# Patient Record
Sex: Female | Born: 1945 | Race: White | Hispanic: No | Smoking: Current every day smoker
Health system: Southern US, Community
[De-identification: ages and names within clinical notes are randomized; demographics above are authoritative.]

## PROBLEM LIST (undated history)

## (undated) DIAGNOSIS — M791 Myalgia, unspecified site: Secondary | ICD-10-CM

## (undated) DIAGNOSIS — R51 Headache: Secondary | ICD-10-CM

## (undated) DIAGNOSIS — R233 Spontaneous ecchymoses: Secondary | ICD-10-CM

## (undated) DIAGNOSIS — R609 Edema, unspecified: Secondary | ICD-10-CM

## (undated) DIAGNOSIS — R05 Cough: Secondary | ICD-10-CM

## (undated) DIAGNOSIS — F329 Major depressive disorder, single episode, unspecified: Secondary | ICD-10-CM

## (undated) DIAGNOSIS — J349 Unspecified disorder of nose and nasal sinuses: Secondary | ICD-10-CM

## (undated) DIAGNOSIS — F32A Depression, unspecified: Secondary | ICD-10-CM

## (undated) DIAGNOSIS — R238 Other skin changes: Secondary | ICD-10-CM

## (undated) DIAGNOSIS — R059 Cough, unspecified: Secondary | ICD-10-CM

## (undated) DIAGNOSIS — J189 Pneumonia, unspecified organism: Secondary | ICD-10-CM

## (undated) DIAGNOSIS — H9190 Unspecified hearing loss, unspecified ear: Secondary | ICD-10-CM

## (undated) DIAGNOSIS — R519 Headache, unspecified: Secondary | ICD-10-CM

## (undated) DIAGNOSIS — M1990 Unspecified osteoarthritis, unspecified site: Secondary | ICD-10-CM

## (undated) HISTORY — DX: Myalgia, unspecified site: M79.10

## (undated) HISTORY — DX: Spontaneous ecchymoses: R23.3

## (undated) HISTORY — PX: APPENDECTOMY: SHX54

## (undated) HISTORY — DX: Cough: R05

## (undated) HISTORY — DX: Unspecified disorder of nose and nasal sinuses: J34.9

## (undated) HISTORY — PX: OTHER SURGICAL HISTORY: SHX169

## (undated) HISTORY — DX: Headache, unspecified: R51.9

## (undated) HISTORY — DX: Headache: R51

## (undated) HISTORY — DX: Cough, unspecified: R05.9

## (undated) HISTORY — DX: Edema, unspecified: R60.9

## (undated) HISTORY — DX: Depression, unspecified: F32.A

## (undated) HISTORY — DX: Other skin changes: R23.8

## (undated) HISTORY — PX: TUBAL LIGATION: SHX77

## (undated) HISTORY — DX: Unspecified hearing loss, unspecified ear: H91.90

## (undated) HISTORY — DX: Major depressive disorder, single episode, unspecified: F32.9

## (undated) HISTORY — PX: TONSILLECTOMY: SUR1361

---

## 2013-03-09 ENCOUNTER — Encounter (INDEPENDENT_AMBULATORY_CARE_PROVIDER_SITE_OTHER): Payer: Self-pay

## 2013-03-09 ENCOUNTER — Ambulatory Visit (INDEPENDENT_AMBULATORY_CARE_PROVIDER_SITE_OTHER): Payer: Medicare Other

## 2013-03-09 VITALS — BP 139/77 | HR 62 | Resp 18

## 2013-03-09 DIAGNOSIS — B070 Plantar wart: Secondary | ICD-10-CM

## 2013-03-09 DIAGNOSIS — Q828 Other specified congenital malformations of skin: Secondary | ICD-10-CM

## 2013-03-09 DIAGNOSIS — M79609 Pain in unspecified limb: Secondary | ICD-10-CM

## 2013-03-09 NOTE — Patient Instructions (Signed)

## 2013-03-09 NOTE — Progress Notes (Signed)
  Subjective:    Patient ID: Sara Adams, female    DOB: 06-12-1945, 67 y.o.   MRN: [id number]  HPI left foot hard place on bottom and trims on it and been going on for about two years    Review of Systems  Constitutional: Negative.   HENT: Positive for hearing loss and sinus pressure.        Ear pain, sneezing  Eyes: Negative.   Respiratory: Negative.   Cardiovascular: Positive for leg swelling.  Gastrointestinal: Negative.   Endocrine: Negative.   Genitourinary: Negative.   Musculoskeletal:       Joint pain and back pain and difficulty walking  Skin: Negative.   Allergic/Immunologic: Negative.   Neurological: Positive for dizziness and headaches.  Hematological: Bruises/bleeds easily.  Psychiatric/Behavioral: Negative.        Objective:   Physical Exam Extremity objective findings as follows vascular status is intact pedal pulses palpable DP and PT +2/4 bilateral. Epicritic and proprioceptive sensations intact and symmetric bilateral . There is normal plantar response and DTRs noted. Dermatologically there is nucleated keratotic lesion sub-fifth MTP area bilateral the left is significantly painful tender on direct lateral compression with slight pinpoint bleeding and debridement. This is treated with a styptic agent following debridement. 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 Plan at this time lesions are debrided packed with 60% salicylic acid under occlusion W09 hours patient is advised to followup on an as-needed basis if there's any recurrence or exacerbation. Dispensed instructions for topical salicylic acid application if there is recurrence and using  duct tape for occlusion. Followup with the next month or 2 if recurs  Alvan Dame DPM

## 2019-04-14 ENCOUNTER — Other Ambulatory Visit: Payer: Self-pay | Admitting: Neurosurgery

## 2019-04-14 DIAGNOSIS — Q282 Arteriovenous malformation of cerebral vessels: Secondary | ICD-10-CM

## 2019-05-03 ENCOUNTER — Other Ambulatory Visit: Payer: Self-pay | Admitting: Neurosurgery

## 2019-05-12 ENCOUNTER — Other Ambulatory Visit (HOSPITAL_COMMUNITY)
Admission: RE | Admit: 2019-05-12 | Discharge: 2019-05-12 | Disposition: A | Discharge status: Home / Self Care | Payer: Medicare HMO | Source: Ambulatory Visit | Attending: Surgery | Admitting: Surgery

## 2019-05-12 DIAGNOSIS — Z01812 Encounter for preprocedural laboratory examination: Secondary | ICD-10-CM | POA: Diagnosis present

## 2019-05-12 DIAGNOSIS — Z20822 Contact with and (suspected) exposure to covid-19: Secondary | ICD-10-CM | POA: Insufficient documentation

## 2019-05-13 LAB — NOVEL CORONAVIRUS, NAA (HOSP ORDER, SEND-OUT TO REF LAB; TAT 18-24 HRS): SARS-CoV-2, NAA: NOT DETECTED

## 2019-05-16 ENCOUNTER — Ambulatory Visit (HOSPITAL_COMMUNITY)
Admission: RE | Admit: 2019-05-16 | Discharge: 2019-05-16 | Disposition: A | Discharge status: Home / Self Care | Payer: Medicare HMO | Source: Ambulatory Visit | Attending: Neurosurgery | Admitting: Neurosurgery

## 2019-05-16 ENCOUNTER — Other Ambulatory Visit: Payer: Self-pay

## 2019-05-16 ENCOUNTER — Other Ambulatory Visit: Payer: Self-pay | Admitting: Neurosurgery

## 2019-05-16 DIAGNOSIS — Z885 Allergy status to narcotic agent status: Secondary | ICD-10-CM | POA: Diagnosis not present

## 2019-05-16 DIAGNOSIS — Z882 Allergy status to sulfonamides status: Secondary | ICD-10-CM | POA: Diagnosis not present

## 2019-05-16 DIAGNOSIS — F17210 Nicotine dependence, cigarettes, uncomplicated: Secondary | ICD-10-CM | POA: Insufficient documentation

## 2019-05-16 DIAGNOSIS — H9190 Unspecified hearing loss, unspecified ear: Secondary | ICD-10-CM | POA: Insufficient documentation

## 2019-05-16 DIAGNOSIS — Z880 Allergy status to penicillin: Secondary | ICD-10-CM | POA: Diagnosis not present

## 2019-05-16 DIAGNOSIS — Q282 Arteriovenous malformation of cerebral vessels: Secondary | ICD-10-CM

## 2019-05-16 DIAGNOSIS — Z886 Allergy status to analgesic agent status: Secondary | ICD-10-CM | POA: Diagnosis not present

## 2019-05-16 HISTORY — PX: IR ANGIO INTRA EXTRACRAN SEL COM CAROTID INNOMINATE UNI R MOD SED: IMG5359

## 2019-05-16 LAB — BASIC METABOLIC PANEL
Anion gap: 10 (ref 5–15)
BUN: 8 mg/dL (ref 8–23)
CO2: 29 mmol/L (ref 22–32)
Calcium: 9.3 mg/dL (ref 8.9–10.3)
Chloride: 102 mmol/L (ref 98–111)
Creatinine, Ser: 0.58 mg/dL (ref 0.44–1.00)
GFR calc Af Amer: >60 mL/min (ref >60)
GFR calc non Af Amer: >60 mL/min (ref >60)
Glucose, Bld: 97 mg/dL (ref 70–99)
Potassium: 3.8 mmol/L (ref 3.5–5.1)
Sodium: 141 mmol/L (ref 135–145)

## 2019-05-16 LAB — CBC WITH DIFFERENTIAL/PLATELET
Abs Immature Granulocytes: 0.01 10*3/uL (ref 0.00–0.07)
Basophils Absolute: 0.1 10*3/uL (ref 0.0–0.1)
Basophils Relative: 1 %
Eosinophils Absolute: 0.2 10*3/uL (ref 0.0–0.5)
Eosinophils Relative: 3 %
HCT: 43.4 % (ref 36.0–46.0)
Hemoglobin: 14.2 g/dL (ref 12.0–15.0)
Immature Granulocytes: 0 %
Lymphocytes Relative: 35 %
Lymphs Abs: 2.2 10*3/uL (ref 0.7–4.0)
MCH: 31.4 pg (ref 26.0–34.0)
MCHC: 32.7 g/dL (ref 30.0–36.0)
MCV: 96 fL (ref 80.0–100.0)
Monocytes Absolute: 0.4 10*3/uL (ref 0.1–1.0)
Monocytes Relative: 7 %
Neutro Abs: 3.4 10*3/uL (ref 1.7–7.7)
Neutrophils Relative %: 54 %
Platelets: 244 10*3/uL (ref 150–400)
RBC: 4.52 MIL/uL (ref 3.87–5.11)
RDW: 12.2 % (ref 11.5–15.5)
WBC: 6.3 10*3/uL (ref 4.0–10.5)
nRBC: 0 % (ref 0.0–0.2)

## 2019-05-16 LAB — PROTIME-INR
INR: 1 (ref 0.8–1.2)
Prothrombin Time: 13 seconds (ref 11.4–15.2)

## 2019-05-16 LAB — APTT: aPTT: 36 seconds (ref 24–36)

## 2019-05-16 MED ORDER — FENTANYL CITRATE (PF) 100 MCG/2ML IJ SOLN
INTRAMUSCULAR | Status: AC | PRN
  Administered 2019-05-16: 25 ug via INTRAVENOUS

## 2019-05-16 MED ORDER — FENTANYL CITRATE (PF) 100 MCG/2ML IJ SOLN
INTRAMUSCULAR | Status: AC
  Filled 2019-05-16: qty 2

## 2019-05-16 MED ORDER — LIDOCAINE HCL 1 % IJ SOLN
INTRAMUSCULAR | Status: AC | PRN
  Administered 2019-05-16: 8 mL

## 2019-05-16 MED ORDER — MIDAZOLAM HCL 2 MG/2ML IJ SOLN
INTRAMUSCULAR | Status: AC | PRN
  Administered 2019-05-16: 0.5 mg via INTRAVENOUS

## 2019-05-16 MED ORDER — SODIUM CHLORIDE 0.9 % IV SOLN: INTRAVENOUS | Status: DC

## 2019-05-16 MED ORDER — NITROGLYCERIN 1 MG/10 ML FOR IR/CATH LAB
INTRA_ARTERIAL | Status: AC
  Filled 2019-05-16: qty 10

## 2019-05-16 MED ORDER — LIDOCAINE HCL 1 % IJ SOLN
INTRAMUSCULAR | Status: AC
  Filled 2019-05-16: qty 20

## 2019-05-16 MED ORDER — VERAPAMIL HCL 2.5 MG/ML IV SOLN
INTRAVENOUS | Status: AC
  Filled 2019-05-16: qty 2

## 2019-05-16 MED ORDER — MIDAZOLAM HCL 2 MG/2ML IJ SOLN
INTRAMUSCULAR | Status: AC
  Filled 2019-05-16: qty 2

## 2019-05-16 MED ORDER — IOHEXOL 300 MG/ML  SOLN
100.0000 mL | Freq: Once | INTRAMUSCULAR | Status: AC | PRN
  Administered 2019-05-16: 20 mL via INTRA_ARTERIAL

## 2019-05-16 MED ORDER — HEPARIN SODIUM (PORCINE) 1000 UNIT/ML IJ SOLN
INTRAMUSCULAR | Status: AC
  Filled 2019-05-16: qty 1

## 2019-05-16 MED ORDER — HYDROCODONE-ACETAMINOPHEN 5-325 MG PO TABS: 1.0000 | ORAL_TABLET | ORAL | Status: DC | PRN

## 2019-05-16 MED ORDER — IOHEXOL 300 MG/ML  SOLN
100.0000 mL | Freq: Once | INTRAMUSCULAR | Status: AC | PRN
  Administered 2019-05-16: 50 mL via INTRA_ARTERIAL

## 2019-05-16 NOTE — H&P (Addendum)
Chief Complaint   AVM  HPI   HPI: Sara Adams is a 74 y.o. 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  She presents today for diagnostic cerebral angiogram for further characterization of the arterial venous malformation.  She is without any concerns.  There are no problems to display for this patient.   PMH: Past Medical History:  Diagnosis Date  . Bruises easily   . Cough   . Depression   . Frequent headaches   . Hearing loss   . Muscle pain   . Sinus problem   . Swelling     PSH: Past Surgical History:  Procedure Laterality Date  . APPENDECTOMY    . tonsills    . TUBAL LIGATION      (Not in a hospital admission)   SH: Social History   Tobacco Use  . Smoking status: Current Every Day Smoker    Packs/day: 1.00    Types: Cigarettes  Substance Use Topics  . Alcohol use: No  . Drug use: No    MEDS: Prior to Admission medications   Medication Sig Start Date End Date Taking? Authorizing Provider  ALPRAZolam Prudy Feeler) 0.5 MG tablet  01/25/13  Yes [provider]  Multiple Vitamin (MULTIVITAMIN) capsule Take 1 capsule by mouth daily.   Yes [provider]  VENTOLIN HFA 108 (90 BASE) MCG/ACT inhaler  01/25/13   [provider]    ALLERGY: Allergies  Allergen Reactions  . Asa [Aspirin]   . Codeine   . Penicillins   . Sulfa Antibiotics     Social History   Tobacco Use  . Smoking status: Current Every Day Smoker    Packs/day: 1.00    Types: Cigarettes  Substance Use Topics  . Alcohol use: No     No family history on file.   ROS   ROS  Exam   Vitals:   05/16/19 0655  BP: 94/60  Pulse: 79  Resp: 20  Temp: 97.9 F (36.6 C)  SpO2: 95%   General appearance: WDWN, NAD Cardiovascular: Regular rate and rhythm without murmurs, rubs, gallops. No edema or variciosities. Distal  pulses normal. Pulmonary: Effort normal, non-labored breathing Musculoskeletal:     Muscle tone upper extremities: Normal    Muscle tone lower extremities: Normal    Motor exam: Upper Extremities Deltoid Bicep Tricep Grip  Right 5/5 5/5 5/5 5/5  Left 5/5 5/5 5/5 5/5   Lower Extremity IP Quad PF DF EHL  Right 5/5 5/5 5/5 5/5 5/5  Left 5/5 5/5 5/5 5/5 5/5   Neurological Mental Status:    - Patient is awake, alert, oriented to person, place, month, year, and situation    - Patient is able to give a clear and coherent history.    - No signs of aphasia or neglect Cranial Nerves    - II: Visual Fields are full. PERRL    - III/IV/VI: EOMI without ptosis or diploplia.     - V: Facial sensation is grossly normal    - VII: Facial movement is symmetric.     - VIII: hearing is intact to voice    - X: Uvula elevates symmetrically    - XI: Shoulder shrug is symmetric.    - XII: tongue is midline without atrophy or fasciculations.  Sensory: Sensation grossly intact to LT  Results - Imaging/Labs  Results for orders placed or performed during the hospital encounter of 05/16/19 (from the past 48 hour(s))  CBC WITH DIFFERENTIAL     Status: None   Collection Time: 05/16/19  6:55 AM  Result Value Ref Range   WBC 6.3 4.0 - 10.5 K/uL   RBC 4.52 3.87 - 5.11 MIL/uL   Hemoglobin 14.2 12.0 - 15.0 g/dL   HCT 43.4 36.0 - 46.0 %   MCV 96.0 80.0 - 100.0 fL   MCH 31.4 26.0 - 34.0 pg   MCHC 32.7 30.0 - 36.0 g/dL   RDW 12.2 11.5 - 15.5 %   Platelets 244 150 - 400 K/uL   nRBC 0.0 0.0 - 0.2 %   Neutrophils Relative % 54 %   Neutro Abs 3.4 1.7 - 7.7 K/uL   Lymphocytes Relative 35 %   Lymphs Abs 2.2 0.7 - 4.0 K/uL   Monocytes Relative 7 %   Monocytes Absolute 0.4 0.1 - 1.0 K/uL   Eosinophils Relative 3 %   Eosinophils Absolute 0.2 0.0 - 0.5 K/uL   Basophils Relative 1 %   Basophils Absolute 0.1 0.0 - 0.1 K/uL   Immature Granulocytes 0 %   Abs Immature Granulocytes 0.01 0.00 - 0.07 K/uL     Comment: Performed at [facility], [address], [place], [place] [address]    No results found.  IMAGING: MRI of the brain dated 04/07/2019 was personally reviewed.  This demonstrates a large likely arteriovenous malformation in the region of the right temporal pole along the sylvian fissure.  Venous drainage appears to be primarily posteriorly through a large anastomotic vein draining into the right transverse sigmoid junction.  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  Impression/Plan   74 y.o. 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 We will proceed with diagnostic cerebral angiogram for further characterization of the AVM.  Risks, benefits alternatives were discussed.  Patient stated understanding and wished to proceed.  Ferne Reus, PA-C [facility] [employer]

## 2019-05-16 NOTE — Sedation Documentation (Signed)
Right groin sheath removed. 5fr exoseal closure device used.  

## 2019-05-16 NOTE — Brief Op Note (Signed)
  NEUROSURGERY BRIEF OPERATIVE  NOTE   PREOP DX: AVM  POSTOP DX: Same  PROCEDURE: Diagnostic cerebral angiogram  SURGEON: Dr. Lisbeth Renshaw, MD  ANESTHESIA: IV Sedation with Local  EBL: Minimal  SPECIMENS: None  COMPLICATIONS: None  CONDITION: Stable to recovery  FINDINGS (Full report in [facility]): 1. Significant proximal tortuosity in the aortic arch and proximal great vessels including significant calcification precluding selective catheterization of the carotid and vertebral arteries 2. Spetzler-Martin grade 2 right temporal AVM

## 2019-05-16 NOTE — Discharge Instructions (Signed)
Angiogram, Care After °This sheet gives you information about how to care for yourself after your procedure. Your doctor may also give you more specific instructions. If you have problems or questions, contact your doctor. °Follow these instructions at home: °Insertion site care °· Follow instructions from your doctor about how to take care of your long, thin tube (catheter) insertion area. Make sure you: °? Wash your hands with soap and water before you change your bandage (dressing). If you cannot use soap and water, use hand sanitizer. °? Change your bandage as told by your doctor. °? Leave stitches (sutures), skin glue, or skin tape (adhesive) strips in place. They may need to stay in place for 2 weeks or longer. If tape strips get loose and curl up, you may trim the loose edges. Do not remove tape strips completely unless your doctor says it is okay. °· Do not take baths, swim, or use a hot tub until your doctor says it is okay. °· You may shower 24-48 hours after the procedure or as told by your doctor. °? Gently wash the area with plain soap and water. °? Pat the area dry with a clean towel. °? Do not rub the area. This may cause bleeding. °· Do not apply powder or lotion to the area. Keep the area clean and dry. °· Check your insertion area every day for signs of infection. Check for: °? More redness, swelling, or pain. °? Fluid or blood. °? Warmth. °? Pus or a bad smell. °Activity °· Rest as told by your doctor, usually for 1-2 days. °· Do not lift anything that is heavier than 10 lbs. (4.5 kg) or as told by your doctor. °· Do not drive for 24 hours if you were given a medicine to help you relax (sedative). °· Do not drive or use heavy machinery while taking prescription pain medicine. °General instructions ° °· Go back to your normal activities as told by your doctor, usually in about a week. Ask your doctor what activities are safe for you. °· If the insertion area starts to bleed, lie flat and put  pressure on the area. If the bleeding does not stop, get help right away. This is an emergency. °· Drink enough fluid to keep your pee (urine) clear or pale yellow. °· Take over-the-counter and prescription medicines only as told by your doctor. °· Keep all follow-up visits as told by your doctor. This is important. °Contact a doctor if: °· You have a fever. °· You have chills. °· You have more redness, swelling, or pain around your insertion area. °· You have fluid or blood coming from your insertion area. °· The insertion area feels warm to the touch. °· You have pus or a bad smell coming from your insertion area. °· You have more bruising around the insertion area. °· Blood collects in the tissue around the insertion area (hematoma) that may be painful to the touch. °Get help right away if: °· You have a lot of pain in the insertion area. °· The insertion area swells very fast. °· The insertion area is bleeding, and the bleeding does not stop after holding steady pressure on the area. °· The area near or just beyond the insertion area becomes pale, cool, tingly, or numb. °These symptoms may be an emergency. Do not wait to see if the symptoms will go away. Get medical help right away. Call your local emergency services (911 in the [place] Do not drive yourself to the hospital. °  Summary °· After the procedure, it is common to have bruising and tenderness at the long, thin tube insertion area. °· After the procedure, it is important to rest and drink plenty of fluids. °· Do not take baths, swim, or use a hot tub until your doctor says it is okay to do so. You may shower 24-48 hours after the procedure or as told by your doctor. °· If the insertion area starts to bleed, lie flat and put pressure on the area. If the bleeding does not stop, get help right away. This is an emergency. °This information is not intended to replace advice given to you by your health care provider. Make sure you discuss any questions you have  with your health care provider. °Document Revised: 03/26/2017 Document Reviewed: 04/07/2016 °Elsevier Patient Education © 2020 [employer] °Moderate Conscious Sedation, Adult, Care After °These instructions provide you with information about caring for yourself after your procedure. Your health care provider may also give you more specific instructions. Your treatment has been planned according to current medical practices, but problems sometimes occur. Call your health care provider if you have any problems or questions after your procedure. °What can I expect after the procedure? °After your procedure, it is common: °· To feel sleepy for several hours. °· To feel clumsy and have poor balance for several hours. °· To have poor judgment for several hours. °· To vomit if you eat too soon. °Follow these instructions at home: °For at least 24 hours after the procedure: ° °· Do not: °? Participate in activities where you could fall or become injured. °? Drive. °? Use heavy machinery. °? Drink alcohol. °? Take sleeping pills or medicines that cause drowsiness. °? Make important decisions or sign legal documents. °? Take care of children on your own. °· Rest. °Eating and drinking °· Follow the diet recommended by your health care provider. °· If you vomit: °? Drink water, juice, or soup when you can drink without vomiting. °? Make sure you have little or no nausea before eating solid foods. °General instructions °· Have a responsible adult stay with you until you are awake and alert. °· Take over-the-counter and prescription medicines only as told by your health care provider. °· If you smoke, do not smoke without supervision. °· Keep all follow-up visits as told by your health care provider. This is important. °Contact a health care provider if: °· You keep feeling nauseous or you keep vomiting. °· You feel light-headed. °· You develop a rash. °· You have a fever. °Get help right away if: °· You have trouble  breathing. °This information is not intended to replace advice given to you by your health care provider. Make sure you discuss any questions you have with your health care provider. °Document Revised: 03/26/2017 Document Reviewed: 08/03/2015 °Elsevier Patient Education © 2020 [employer] Inc. ° °

## 2019-06-28 ENCOUNTER — Other Ambulatory Visit: Payer: Self-pay | Admitting: Neurosurgery

## 2019-06-28 DIAGNOSIS — Q282 Arteriovenous malformation of cerebral vessels: Secondary | ICD-10-CM

## 2019-06-29 ENCOUNTER — Other Ambulatory Visit: Payer: Self-pay | Admitting: Neurosurgery

## 2019-07-11 ENCOUNTER — Encounter (HOSPITAL_COMMUNITY)
Admission: RE | Admit: 2019-07-11 | Discharge: 2019-07-11 | Disposition: A | Discharge status: Home / Self Care | Payer: Medicare HMO | Source: Ambulatory Visit | Attending: Neurosurgery | Admitting: Neurosurgery

## 2019-07-11 ENCOUNTER — Encounter (HOSPITAL_COMMUNITY): Payer: Self-pay

## 2019-07-11 ENCOUNTER — Other Ambulatory Visit (HOSPITAL_COMMUNITY): Payer: Medicare HMO

## 2019-07-11 ENCOUNTER — Other Ambulatory Visit: Payer: Self-pay

## 2019-07-11 DIAGNOSIS — Z79899 Other long term (current) drug therapy: Secondary | ICD-10-CM | POA: Diagnosis not present

## 2019-07-11 DIAGNOSIS — F17210 Nicotine dependence, cigarettes, uncomplicated: Secondary | ICD-10-CM | POA: Diagnosis not present

## 2019-07-11 DIAGNOSIS — Z01812 Encounter for preprocedural laboratory examination: Secondary | ICD-10-CM | POA: Insufficient documentation

## 2019-07-11 DIAGNOSIS — Q282 Arteriovenous malformation of cerebral vessels: Secondary | ICD-10-CM | POA: Diagnosis not present

## 2019-07-11 HISTORY — DX: Unspecified osteoarthritis, unspecified site: M19.90

## 2019-07-11 HISTORY — DX: Pneumonia, unspecified organism: J18.9

## 2019-07-11 LAB — CBC
HCT: 42.1 % (ref 36.0–46.0)
Hemoglobin: 13.7 g/dL (ref 12.0–15.0)
MCH: 31.4 pg (ref 26.0–34.0)
MCHC: 32.5 g/dL (ref 30.0–36.0)
MCV: 96.6 fL (ref 80.0–100.0)
Platelets: 240 10*3/uL (ref 150–400)
RBC: 4.36 MIL/uL (ref 3.87–5.11)
RDW: 12.1 % (ref 11.5–15.5)
WBC: 7.2 10*3/uL (ref 4.0–10.5)
nRBC: 0 % (ref 0.0–0.2)

## 2019-07-11 LAB — ABO/RH: ABO/RH(D): O POS

## 2019-07-11 NOTE — Progress Notes (Signed)
PCP - Mauricio Po Cardiologist - denies  Chest x-ray - n/a EKG - n/a  COVID TEST- Saturday, 07-15-19   Anesthesia review: n/a  Patient denies shortness of breath, fever, cough and chest pain at PAT appointment   All instructions explained to the patient, with a verbal understanding of the material. Patient agrees to go over the instructions while at home for a better understanding. Patient also instructed to self quarantine after being tested for COVID-19. The opportunity to ask questions was provided.

## 2019-07-11 NOTE — Progress Notes (Signed)
Per Lowella Bandy at Dr. Val Riles office:  At PAT appointment only get consent and blood work (per anesthesia) for Craniotomy. Patient will have follow up Arteriogram the day after Craniotomy surgery.

## 2019-07-11 NOTE — Progress Notes (Signed)
[facility] - Sara Adams, Sara Adams - [address] [place] [address] Phone: [phone] Fax: [phone]      Your procedure is scheduled on Tuesday, March 23rd.  Report to [facility] Main Entrance "A" at 5:30 A.M., and check in at the Admitting office.  Call this number if you have problems the morning of surgery:  [phone]  Call [phone] if you have any questions prior to your surgery date Monday-Friday 8am-4pm    Remember:  Do not eat or drink after midnight the night before your surgery     Take these medicines the morning of surgery with A SIP OF WATER   Alprazolam (Xanax) - if needed  7 days prior to surgery STOP taking any Aspirin (unless otherwise instructed by your surgeon), Aleve, Naproxen, Ibuprofen, Motrin, Advil, Goody's, BC's, all herbal medications, fish oil, and all vitamins.    The Morning of Surgery  Do not wear jewelry, make-up or nail polish.  Do not wear lotions, powders, or perfumes, or deodorant  Do not shave 48 hours prior to surgery.    Do not bring valuables to the hospital.  [facility] is not responsible for any belongings or valuables.  If you are a smoker, DO NOT Smoke 24 hours prior to surgery  If you wear a CPAP at night please bring your mask the morning of surgery   Remember that you must have someone to transport you home after your surgery, and remain with you for 24 hours if you are discharged the same day.   Please bring cases for contacts, glasses, hearing aids, dentures or bridgework because it cannot be worn into surgery.    Leave your suitcase in the car.  After surgery it may be brought to your room.  For patients admitted to the hospital, discharge time will be determined by your treatment team.  Patients discharged the day of surgery will not be allowed to drive home.    Special instructions:   [facility]- Preparing For Surgery  Before surgery, you can play an important role. Because  skin is not sterile, your skin needs to be as free of germs as possible. You can reduce the number of germs on your skin by washing with CHG (chlorahexidine gluconate) Soap before surgery.  CHG is an antiseptic cleaner which kills germs and bonds with the skin to continue killing germs even after washing.    Oral Hygiene is also important to reduce your risk of infection.  Remember - BRUSH YOUR TEETH THE MORNING OF SURGERY WITH YOUR REGULAR TOOTHPASTE  Please do not use if you have an allergy to CHG or antibacterial soaps. If your skin becomes reddened/irritated stop using the CHG.  Do not shave (including legs and underarms) for at least 48 hours prior to first CHG shower. It is OK to shave your face.  Please follow these instructions carefully.   1. Shower the NIGHT BEFORE SURGERY and the MORNING OF SURGERY with CHG Soap.   2. If you chose to wash your hair, wash your hair first as usual with your normal shampoo.  3. After you shampoo, rinse your hair and body thoroughly to remove the shampoo.  4. Use CHG as you would any other liquid soap. You can apply CHG directly to the skin and wash gently with a scrungie or a clean washcloth.   5. Apply the CHG Soap to your body ONLY FROM THE NECK DOWN.  Do not use on open wounds  or open sores. Avoid contact with your eyes, ears, mouth and genitals (private parts). Wash Face and genitals (private parts)  with your normal soap.   6. Wash thoroughly, paying special attention to the area where your surgery will be performed.  7. Thoroughly rinse your body with warm water from the neck down.  8. DO NOT shower/wash with your normal soap after using and rinsing off the CHG Soap.  9. Pat yourself dry with a CLEAN TOWEL.  10. Wear CLEAN PAJAMAS to bed the night before surgery, wear comfortable clothes the morning of surgery  11. Place CLEAN SHEETS on your bed the night of your first shower and DO NOT SLEEP WITH PETS.    Day of Surgery:  Please  shower the morning of surgery with the CHG soap Do not apply any deodorants/lotions. Please wear clean clothes to the hospital/surgery center.   Remember to brush your teeth WITH YOUR REGULAR TOOTHPASTE.   Please read over the following fact sheets that you were given.

## 2019-07-13 ENCOUNTER — Other Ambulatory Visit: Payer: Self-pay | Admitting: Neurosurgery

## 2019-07-13 DIAGNOSIS — Q282 Arteriovenous malformation of cerebral vessels: Secondary | ICD-10-CM

## 2019-07-14 ENCOUNTER — Other Ambulatory Visit (HOSPITAL_COMMUNITY): Payer: Medicare HMO

## 2019-07-15 ENCOUNTER — Other Ambulatory Visit (HOSPITAL_COMMUNITY)
Admission: RE | Admit: 2019-07-15 | Discharge: 2019-07-15 | Disposition: A | Discharge status: Home / Self Care | Payer: Medicare HMO | Source: Ambulatory Visit | Attending: Neurosurgery | Admitting: Neurosurgery

## 2019-07-15 DIAGNOSIS — Z01812 Encounter for preprocedural laboratory examination: Secondary | ICD-10-CM | POA: Insufficient documentation

## 2019-07-15 DIAGNOSIS — Z20822 Contact with and (suspected) exposure to covid-19: Secondary | ICD-10-CM | POA: Insufficient documentation

## 2019-07-15 LAB — SARS CORONAVIRUS 2 (TAT 6-24 HRS): SARS Coronavirus 2: NEGATIVE

## 2019-07-17 ENCOUNTER — Other Ambulatory Visit: Payer: Self-pay

## 2019-07-17 ENCOUNTER — Ambulatory Visit (HOSPITAL_COMMUNITY)
Admission: RE | Admit: 2019-07-17 | Discharge: 2019-07-17 | Disposition: A | Discharge status: Home / Self Care | Payer: Medicare HMO | Source: Ambulatory Visit | Attending: Neurosurgery | Admitting: Neurosurgery

## 2019-07-17 DIAGNOSIS — Q282 Arteriovenous malformation of cerebral vessels: Secondary | ICD-10-CM

## 2019-07-17 MED ORDER — VANCOMYCIN HCL IN DEXTROSE 1-5 GM/200ML-% IV SOLN: 1000.0000 mg | INTRAVENOUS | Status: DC

## 2019-07-17 MED ORDER — CHLORHEXIDINE GLUCONATE CLOTH 2 % EX PADS: 6.0000 | MEDICATED_PAD | Freq: Once | CUTANEOUS | Status: DC

## 2019-07-17 NOTE — H&P (Signed)
Chief Complaint   Arteriovenous malformation  HPI   HPI: Sara Adams is a 74 y.o. female who was found to have a large right temporal arterial venous malformation during workup for left facial droop and intermittent episodes of left arm weakness. She underwent diagnostic cerebral angiogram for further characterization.  She presents today for surgical resection of the arteriovenous malformation.  She is without any concerns.  Of note, she also developed involuntary movements of her left arm intermittently.  She was started on Keppra 500 mg b.i.d.  There are no problems to display for this patient.   PMH: Past Medical History:  Diagnosis Date  . Arthritis   . Bruises easily   . Cough   . Depression   . Frequent headaches   . Hearing loss   . Muscle pain   . Pneumonia   . Sinus problem   . Swelling     PSH: Past Surgical History:  Procedure Laterality Date  . APPENDECTOMY    . IR ANGIO INTRA EXTRACRAN SEL COM CAROTID INNOMINATE UNI R MOD SED  05/16/2019  . TONSILLECTOMY    . tonsills    . TUBAL LIGATION      No medications prior to admission.    SH: Social History   Tobacco Use  . Smoking status: Current Every Day Smoker    Packs/day: 1.00    Types: Cigarettes  . Smokeless tobacco: Never Used  Substance Use Topics  . Alcohol use: No  . Drug use: No    MEDS: Prior to Admission medications   Medication Sig Start Date End Date Taking? Authorizing Provider  ALPRAZolam Duanne Moron) 0.5 MG tablet Take 0.5 mg by mouth 2 (two) times daily as needed for anxiety.  01/25/13  Yes [provider]  Cholecalciferol 25 MCG (1000 UT) tablet Take 1,000 Units by mouth daily.   Yes [provider]  ibuprofen (ADVIL) 200 MG tablet Take 200-400 mg by mouth every 6 (six) hours as needed for headache or moderate pain.    Yes [provider]    ALLERGY: Allergies  Allergen Reactions  . Asa [Aspirin]   . Codeine   . Penicillins   . Sulfa Antibiotics       Social History   Tobacco Use  . Smoking status: Current Every Day Smoker    Packs/day: 1.00    Types: Cigarettes  . Smokeless tobacco: Never Used  Substance Use Topics  . Alcohol use: No     No family history on file.   ROS   ROS  Exam   There were no vitals filed for this visit. General appearance: WDWN, NAD Eyes: No scleral injection Cardiovascular: Regular rate and rhythm without murmurs, rubs, gallops. No edema or variciosities. Distal pulses normal. Pulmonary: Effort normal, non-labored breathing Musculoskeletal:     Muscle tone upper extremities: Normal    Muscle tone lower extremities: Normal    Motor exam: Upper Extremities Deltoid Bicep Tricep Grip  Right 5/5 5/5 5/5 5/5  Left 5/5 5/5 5/5 5/5   Lower Extremity IP Quad PF DF EHL  Right 5/5 5/5 5/5 5/5 5/5  Left 5/5 5/5 5/5 5/5 5/5   Neurological Mental Status:    - Patient is awake, alert, oriented to person, place, month, year, and situation    - Patient is able to give a clear and coherent history.    - No signs of aphasia or neglect Cranial Nerves    - II: Visual Fields are full. PERRL    -  III/IV/VI: EOMI without ptosis or diploplia.     - V: Facial sensation is grossly normal    - VII: Facial movement is symmetric.     - VIII: hearing is intact to voice    - X: Uvula elevates symmetrically    - XI: Shoulder shrug is symmetric.    - XII: tongue is midline without atrophy or fasciculations.  Sensory: Sensation grossly intact to LT   Results - Imaging/Labs   Results for orders placed or performed during the hospital encounter of 07/15/19 (from the past 48 hour(s))  SARS CORONAVIRUS 2 (TAT 6-24 HRS) Nasopharyngeal Nasopharyngeal Swab     Status: None   Collection Time: 07/15/19  1:24 PM   Specimen: Nasopharyngeal Swab  Result Value Ref Range   SARS Coronavirus 2 NEGATIVE NEGATIVE    Comment: (NOTE) SARS-CoV-2 target nucleic acids are NOT DETECTED. The SARS-CoV-2 RNA is generally detectable  in upper and lower respiratory specimens during the acute phase of infection. Negative results do not preclude SARS-CoV-2 infection, do not rule out co-infections with other pathogens, and should not be used as the sole basis for treatment or other patient management decisions. Negative results must be combined with clinical observations, patient history, and epidemiological information. The expected result is Negative. Fact Sheet for Patients: HairSlick.no Fact Sheet for Healthcare Providers: quierodirigir.com This test is not yet approved or cleared by the [place] FDA and  has been authorized for detection and/or diagnosis of SARS-CoV-2 by FDA under an Emergency Use Authorization (EUA). This EUA will remain  in effect (meaning this test can be used) for the duration of the COVID-19 declaration under Section 56 4(b)(1) of the Act, 21 U.S.C. section 360bbb-3(b)(1), unless the authorization is terminated or revoked sooner. Performed at [facility], [address] [place], [place] [address]     No results found.  IMAGING: Diagnostic cerebral angiogram was again reviewed demonstrating the presence of a approximately 4 cm right temporal arteriovenous malformation without any identified in flow or intra Nidal aneurysms.  There are no identified venous outflow varices with superficial venous drainage.  Impression/Plan   74 y.o. 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  Unclear whether her left-sided symptoms represent vascular steal verses focal seizure.  Given the symptomatic nature and potential risk of rupture it was recommended she undergo surgical resection.     We will proceed with right temporal craniotomy for resection of the arteriovenous malformation with follow-up diagnostic cerebral angiogram tomorrow.  Risks, benefits and alternatives were discussed.  Patient  stated understanding and wished to proceed.  Cindra Presume, PA-C [facility]

## 2019-07-17 NOTE — Anesthesia Preprocedure Evaluation (Addendum)
Anesthesia Evaluation  Patient identified by MRN, date of birth, ID band Patient awake    Reviewed: Allergy & Precautions, NPO status , Patient's Chart, lab work & pertinent test results  History of Anesthesia Complications Negative for: history of anesthetic complications  Airway Mallampati: II  TM Distance: >3 FB Neck ROM: Full    Dental  (+) Upper Dentures, Lower Dentures   Pulmonary Current Smoker and Patient abstained from smoking.,    Pulmonary exam normal        Cardiovascular negative cardio ROS Normal cardiovascular exam     Neuro/Psych Intracranial AV malformation negative psych ROS   GI/Hepatic negative GI ROS, Neg liver ROS,   Endo/Other  negative endocrine ROS  Renal/GU negative Renal ROS  negative genitourinary   Musculoskeletal negative musculoskeletal ROS (+)   Abdominal   Peds  Hematology negative hematology ROS (+)   Anesthesia Other Findings   Reproductive/Obstetrics                           Anesthesia Physical Anesthesia Plan  ASA: III  Anesthesia Plan: General   Post-op Pain Management:    Induction: Intravenous  PONV Risk Score and Plan: 2 and Ondansetron, Dexamethasone, Treatment may vary due to age or medical condition and Midazolam  Airway Management Planned: Oral ETT  Additional Equipment: Arterial line  Intra-op Plan:   Post-operative Plan: Extubation in OR  Informed Consent: I have reviewed the patients History and Physical, chart, labs and discussed the procedure including the risks, benefits and alternatives for the proposed anesthesia with the patient or authorized representative who has indicated his/her understanding and acceptance.     Dental advisory given  Plan Discussed with:   Anesthesia Plan Comments: (Art line, large bore PIV x2)      Anesthesia Quick Evaluation

## 2019-07-18 ENCOUNTER — Inpatient Hospital Stay (HOSPITAL_COMMUNITY)
Admission: RE | Admit: 2019-07-18 | Discharge: 2019-07-27 | DRG: 025 | Disposition: E | Payer: Medicare HMO | Attending: Neurosurgery | Admitting: Neurosurgery

## 2019-07-18 ENCOUNTER — Other Ambulatory Visit: Payer: Self-pay

## 2019-07-18 ENCOUNTER — Inpatient Hospital Stay (HOSPITAL_COMMUNITY): Admission: RE | Disposition: E | Payer: Self-pay | Source: Ambulatory Visit | Attending: Neurosurgery

## 2019-07-18 ENCOUNTER — Inpatient Hospital Stay (HOSPITAL_COMMUNITY): Payer: Medicare HMO | Admitting: Anesthesiology

## 2019-07-18 ENCOUNTER — Encounter (HOSPITAL_COMMUNITY): Payer: Self-pay | Admitting: Neurosurgery

## 2019-07-18 DIAGNOSIS — Q2730 Arteriovenous malformation, site unspecified: Secondary | ICD-10-CM | POA: Diagnosis not present

## 2019-07-18 DIAGNOSIS — G2401 Drug induced subacute dyskinesia: Secondary | ICD-10-CM | POA: Diagnosis present

## 2019-07-18 DIAGNOSIS — M1990 Unspecified osteoarthritis, unspecified site: Secondary | ICD-10-CM | POA: Diagnosis present

## 2019-07-18 DIAGNOSIS — I629 Nontraumatic intracranial hemorrhage, unspecified: Secondary | ICD-10-CM | POA: Diagnosis not present

## 2019-07-18 DIAGNOSIS — S065X9A Traumatic subdural hemorrhage with loss of consciousness of unspecified duration, initial encounter: Secondary | ICD-10-CM | POA: Diagnosis present

## 2019-07-18 DIAGNOSIS — G9751 Postprocedural hemorrhage and hematoma of a nervous system organ or structure following a nervous system procedure: Secondary | ICD-10-CM | POA: Diagnosis not present

## 2019-07-18 DIAGNOSIS — Z20822 Contact with and (suspected) exposure to covid-19: Secondary | ICD-10-CM | POA: Diagnosis present

## 2019-07-18 DIAGNOSIS — Z66 Do not resuscitate: Secondary | ICD-10-CM | POA: Diagnosis present

## 2019-07-18 DIAGNOSIS — G9340 Encephalopathy, unspecified: Secondary | ICD-10-CM | POA: Diagnosis present

## 2019-07-18 DIAGNOSIS — R400 Somnolence: Secondary | ICD-10-CM | POA: Diagnosis present

## 2019-07-18 DIAGNOSIS — G40101 Localization-related (focal) (partial) symptomatic epilepsy and epileptic syndromes with simple partial seizures, not intractable, with status epilepticus: Secondary | ICD-10-CM | POA: Diagnosis present

## 2019-07-18 DIAGNOSIS — I97711 Intraoperative cardiac arrest during other surgery: Secondary | ICD-10-CM | POA: Diagnosis not present

## 2019-07-18 DIAGNOSIS — F329 Major depressive disorder, single episode, unspecified: Secondary | ICD-10-CM | POA: Diagnosis present

## 2019-07-18 DIAGNOSIS — Y838 Other surgical procedures as the cause of abnormal reaction of the patient, or of later complication, without mention of misadventure at the time of the procedure: Secondary | ICD-10-CM | POA: Diagnosis not present

## 2019-07-18 DIAGNOSIS — F17210 Nicotine dependence, cigarettes, uncomplicated: Secondary | ICD-10-CM | POA: Diagnosis present

## 2019-07-18 DIAGNOSIS — Q282 Arteriovenous malformation of cerebral vessels: Secondary | ICD-10-CM | POA: Diagnosis present

## 2019-07-18 DIAGNOSIS — R570 Cardiogenic shock: Secondary | ICD-10-CM | POA: Diagnosis not present

## 2019-07-18 DIAGNOSIS — J9601 Acute respiratory failure with hypoxia: Secondary | ICD-10-CM | POA: Diagnosis not present

## 2019-07-18 DIAGNOSIS — E876 Hypokalemia: Secondary | ICD-10-CM | POA: Diagnosis not present

## 2019-07-18 DIAGNOSIS — Z882 Allergy status to sulfonamides status: Secondary | ICD-10-CM

## 2019-07-18 DIAGNOSIS — Z886 Allergy status to analgesic agent status: Secondary | ICD-10-CM

## 2019-07-18 DIAGNOSIS — Z515 Encounter for palliative care: Secondary | ICD-10-CM | POA: Diagnosis not present

## 2019-07-18 DIAGNOSIS — X58XXXA Exposure to other specified factors, initial encounter: Secondary | ICD-10-CM | POA: Diagnosis present

## 2019-07-18 DIAGNOSIS — J95821 Acute postprocedural respiratory failure: Secondary | ICD-10-CM | POA: Diagnosis not present

## 2019-07-18 DIAGNOSIS — R29810 Facial weakness: Secondary | ICD-10-CM | POA: Diagnosis present

## 2019-07-18 DIAGNOSIS — S0083XA Contusion of other part of head, initial encounter: Secondary | ICD-10-CM | POA: Diagnosis present

## 2019-07-18 DIAGNOSIS — R579 Shock, unspecified: Secondary | ICD-10-CM | POA: Diagnosis not present

## 2019-07-18 DIAGNOSIS — H02401 Unspecified ptosis of right eyelid: Secondary | ICD-10-CM | POA: Diagnosis present

## 2019-07-18 DIAGNOSIS — G935 Compression of brain: Secondary | ICD-10-CM | POA: Diagnosis present

## 2019-07-18 DIAGNOSIS — Z419 Encounter for procedure for purposes other than remedying health state, unspecified: Secondary | ICD-10-CM

## 2019-07-18 DIAGNOSIS — R259 Unspecified abnormal involuntary movements: Secondary | ICD-10-CM | POA: Diagnosis not present

## 2019-07-18 DIAGNOSIS — R471 Dysarthria and anarthria: Secondary | ICD-10-CM | POA: Diagnosis present

## 2019-07-18 DIAGNOSIS — Z880 Allergy status to penicillin: Secondary | ICD-10-CM

## 2019-07-18 DIAGNOSIS — R569 Unspecified convulsions: Secondary | ICD-10-CM | POA: Diagnosis not present

## 2019-07-18 DIAGNOSIS — Z79899 Other long term (current) drug therapy: Secondary | ICD-10-CM | POA: Diagnosis not present

## 2019-07-18 DIAGNOSIS — I959 Hypotension, unspecified: Secondary | ICD-10-CM | POA: Diagnosis not present

## 2019-07-18 DIAGNOSIS — Z885 Allergy status to narcotic agent status: Secondary | ICD-10-CM

## 2019-07-18 DIAGNOSIS — G40109 Localization-related (focal) (partial) symptomatic epilepsy and epileptic syndromes with simple partial seizures, not intractable, without status epilepticus: Secondary | ICD-10-CM | POA: Diagnosis not present

## 2019-07-18 DIAGNOSIS — I700 Atherosclerosis of aorta: Secondary | ICD-10-CM | POA: Diagnosis present

## 2019-07-18 HISTORY — PX: CRANIOTOMY: SHX93

## 2019-07-18 HISTORY — PX: APPLICATION OF CRANIAL NAVIGATION: SHX6578

## 2019-07-18 LAB — CBC WITH DIFFERENTIAL/PLATELET
Abs Immature Granulocytes: 0.01 10*3/uL (ref 0.00–0.07)
Basophils Absolute: 0.1 10*3/uL (ref 0.0–0.1)
Basophils Relative: 1 %
Eosinophils Absolute: 0.2 10*3/uL (ref 0.0–0.5)
Eosinophils Relative: 3 %
HCT: 40.3 % (ref 36.0–46.0)
Hemoglobin: 13 g/dL (ref 12.0–15.0)
Immature Granulocytes: 0 %
Lymphocytes Relative: 36 %
Lymphs Abs: 2.1 10*3/uL (ref 0.7–4.0)
MCH: 30.8 pg (ref 26.0–34.0)
MCHC: 32.3 g/dL (ref 30.0–36.0)
MCV: 95.5 fL (ref 80.0–100.0)
Monocytes Absolute: 0.4 10*3/uL (ref 0.1–1.0)
Monocytes Relative: 7 %
Neutro Abs: 3 10*3/uL (ref 1.7–7.7)
Neutrophils Relative %: 53 %
Platelets: 212 10*3/uL (ref 150–400)
RBC: 4.22 MIL/uL (ref 3.87–5.11)
RDW: 12.2 % (ref 11.5–15.5)
WBC: 5.6 10*3/uL (ref 4.0–10.5)
nRBC: 0 % (ref 0.0–0.2)

## 2019-07-18 LAB — POCT I-STAT 7, (LYTES, BLD GAS, ICA,H+H)
Acid-base deficit: 2 mmol/L (ref 0.0–2.0)
Bicarbonate: 26.9 mmol/L (ref 20.0–28.0)
Bicarbonate: 24.6 mmol/L (ref 20.0–28.0)
Calcium, Ion: 1.16 mmol/L (ref 1.15–1.40)
Calcium, Ion: 1.12 mmol/L — ABNORMAL LOW (ref 1.15–1.40)
HCT: 35 % — ABNORMAL LOW (ref 36.0–46.0)
HCT: 30 % — ABNORMAL LOW (ref 36.0–46.0)
Hemoglobin: 11.9 g/dL — ABNORMAL LOW (ref 12.0–15.0)
Hemoglobin: 10.2 g/dL — ABNORMAL LOW (ref 12.0–15.0)
O2 Saturation: 100 %
O2 Saturation: 99 %
Patient temperature: 36.3
Patient temperature: 37.8
Potassium: 3.6 mmol/L (ref 3.5–5.1)
Potassium: 3.9 mmol/L (ref 3.5–5.1)
Sodium: 139 mmol/L (ref 135–145)
Sodium: 139 mmol/L (ref 135–145)
TCO2: 28 mmol/L (ref 22–32)
TCO2: 26 mmol/L (ref 22–32)
pCO2 arterial: 50.2 mmHg — ABNORMAL HIGH (ref 32.0–48.0)
pCO2 arterial: 49.4 mmHg — ABNORMAL HIGH (ref 32.0–48.0)
pH, Arterial: 7.334 — ABNORMAL LOW (ref 7.350–7.450)
pH, Arterial: 7.309 — ABNORMAL LOW (ref 7.350–7.450)
pO2, Arterial: 258 mmHg — ABNORMAL HIGH (ref 83.0–108.0)
pO2, Arterial: 177 mmHg — ABNORMAL HIGH (ref 83.0–108.0)

## 2019-07-18 LAB — PROTIME-INR
INR: 1 (ref 0.8–1.2)
Prothrombin Time: 12.9 seconds (ref 11.4–15.2)

## 2019-07-18 LAB — URINALYSIS, ROUTINE W REFLEX MICROSCOPIC
Bilirubin Urine: NEGATIVE
Glucose, UA: NEGATIVE mg/dL
Ketones, ur: NEGATIVE mg/dL
Nitrite: NEGATIVE
Protein, ur: NEGATIVE mg/dL
Specific Gravity, Urine: 1.016 (ref 1.005–1.030)
pH: 5 (ref 5.0–8.0)

## 2019-07-18 LAB — BASIC METABOLIC PANEL
Anion gap: 8 (ref 5–15)
BUN: 12 mg/dL (ref 8–23)
CO2: 26 mmol/L (ref 22–32)
Calcium: 9 mg/dL (ref 8.9–10.3)
Chloride: 106 mmol/L (ref 98–111)
Creatinine, Ser: 0.37 mg/dL — ABNORMAL LOW (ref 0.44–1.00)
GFR calc Af Amer: >60 mL/min (ref >60)
GFR calc non Af Amer: >60 mL/min (ref >60)
Glucose, Bld: 102 mg/dL — ABNORMAL HIGH (ref 70–99)
Potassium: 3.8 mmol/L (ref 3.5–5.1)
Sodium: 140 mmol/L (ref 135–145)

## 2019-07-18 LAB — PREPARE RBC (CROSSMATCH)

## 2019-07-18 LAB — MRSA PCR SCREENING: MRSA by PCR: NEGATIVE

## 2019-07-18 LAB — APTT: aPTT: 33 seconds (ref 24–36)

## 2019-07-18 SURGERY — CRANIOTOMY INTRACRANIAL ARTERIO-VENOUS MALFORMATION DURAL COMPLEX (AVM)
Anesthesia: General | Laterality: Right

## 2019-07-18 MED ORDER — OXYCODONE HCL 5 MG/5ML PO SOLN: 5.0000 mg | Freq: Once | ORAL | Status: DC | PRN

## 2019-07-18 MED ORDER — ONDANSETRON HCL 4 MG/2ML IJ SOLN: 4.0000 mg | INTRAMUSCULAR | Status: DC | PRN

## 2019-07-18 MED ORDER — BISACODYL 5 MG PO TBEC: 5.0000 mg | DELAYED_RELEASE_TABLET | Freq: Every day | ORAL | Status: DC | PRN

## 2019-07-18 MED ORDER — THROMBIN 5000 UNITS EX SOLR
CUTANEOUS | Status: AC
  Filled 2019-07-18: qty 5000

## 2019-07-18 MED ORDER — LIDOCAINE 2% (20 MG/ML) 5 ML SYRINGE
INTRAMUSCULAR | Status: DC | PRN
  Administered 2019-07-18: 60 mg via INTRAVENOUS

## 2019-07-18 MED ORDER — NALOXONE HCL 0.4 MG/ML IJ SOLN: 0.0800 mg | INTRAMUSCULAR | Status: DC | PRN

## 2019-07-18 MED ORDER — LIDOCAINE-EPINEPHRINE 1 %-1:100000 IJ SOLN
INTRAMUSCULAR | Status: AC
  Filled 2019-07-18: qty 1

## 2019-07-18 MED ORDER — DEXAMETHASONE SODIUM PHOSPHATE 10 MG/ML IJ SOLN
INTRAMUSCULAR | Status: AC
  Filled 2019-07-18: qty 1

## 2019-07-18 MED ORDER — PROMETHAZINE HCL 25 MG PO TABS: 12.5000 mg | ORAL_TABLET | ORAL | Status: DC | PRN

## 2019-07-18 MED ORDER — ROCURONIUM BROMIDE 10 MG/ML (PF) SYRINGE
PREFILLED_SYRINGE | INTRAVENOUS | Status: AC
  Filled 2019-07-18: qty 10

## 2019-07-18 MED ORDER — LABETALOL HCL 5 MG/ML IV SOLN: 10.0000 mg | INTRAVENOUS | Status: DC | PRN

## 2019-07-18 MED ORDER — SODIUM CHLORIDE 0.9 % IV SOLN
INTRAVENOUS | Status: DC | PRN
  Administered 2019-07-18: 500 mg via INTRAVENOUS

## 2019-07-18 MED ORDER — VANCOMYCIN HCL IN DEXTROSE 1-5 GM/200ML-% IV SOLN
1000.0000 mg | INTRAVENOUS | Status: AC
  Administered 2019-07-18: 1000 mg via INTRAVENOUS
  Filled 2019-07-18: qty 200

## 2019-07-18 MED ORDER — PHENYLEPHRINE HCL-NACL 10-0.9 MG/250ML-% IV SOLN
0.0000 ug/min | INTRAVENOUS | Status: DC
  Administered 2019-07-18: 13.333 ug/min via INTRAVENOUS
  Administered 2019-07-18: 150 ug/min via INTRAVENOUS
  Administered 2019-07-18: 70 ug/min via INTRAVENOUS
  Administered 2019-07-18: 50 ug/min via INTRAVENOUS
  Administered 2019-07-19: 200 ug/min via INTRAVENOUS
  Administered 2019-07-19: 180 ug/min via INTRAVENOUS
  Administered 2019-07-19: 150 ug/min via INTRAVENOUS
  Administered 2019-07-19: 200 ug/min via INTRAVENOUS
  Administered 2019-07-19 (×2): 100 ug/min via INTRAVENOUS
  Administered 2019-07-19: 120 ug/min via INTRAVENOUS
  Administered 2019-07-19: 200 ug/min via INTRAVENOUS
  Filled 2019-07-18 (×2): qty 500
  Filled 2019-07-18 (×3): qty 250
  Filled 2019-07-18 (×3): qty 500

## 2019-07-18 MED ORDER — LIDOCAINE 2% (20 MG/ML) 5 ML SYRINGE
INTRAMUSCULAR | Status: AC
  Filled 2019-07-18: qty 5

## 2019-07-18 MED ORDER — SUGAMMADEX SODIUM 200 MG/2ML IV SOLN
INTRAVENOUS | Status: DC | PRN
  Administered 2019-07-18: 200 mg via INTRAVENOUS

## 2019-07-18 MED ORDER — VASOPRESSIN 20 UNIT/ML IV SOLN
INTRAVENOUS | Status: DC | PRN
  Administered 2019-07-18: 2 [IU] via INTRAVENOUS
  Administered 2019-07-18 (×6): 1 [IU] via INTRAVENOUS
  Administered 2019-07-18 (×2): 2 [IU] via INTRAVENOUS
  Administered 2019-07-18 (×3): 1 [IU] via INTRAVENOUS

## 2019-07-18 MED ORDER — EPHEDRINE SULFATE-NACL 50-0.9 MG/10ML-% IV SOSY
PREFILLED_SYRINGE | INTRAVENOUS | Status: DC | PRN
  Administered 2019-07-18 (×2): 10 mg via INTRAVENOUS

## 2019-07-18 MED ORDER — PHENYLEPHRINE HCL-NACL 10-0.9 MG/250ML-% IV SOLN
INTRAVENOUS | Status: AC
  Filled 2019-07-18: qty 250

## 2019-07-18 MED ORDER — ROCURONIUM BROMIDE 10 MG/ML (PF) SYRINGE
PREFILLED_SYRINGE | INTRAVENOUS | Status: DC | PRN
  Administered 2019-07-18: 100 mg via INTRAVENOUS
  Administered 2019-07-18 (×4): 20 mg via INTRAVENOUS

## 2019-07-18 MED ORDER — ACETAMINOPHEN 325 MG PO TABS
650.0000 mg | ORAL_TABLET | ORAL | Status: DC | PRN
  Administered 2019-07-18 – 2019-07-21 (×6): 650 mg via ORAL
  Filled 2019-07-18 (×8): qty 2

## 2019-07-18 MED ORDER — THROMBIN 20000 UNITS EX SOLR
CUTANEOUS | Status: DC | PRN
  Administered 2019-07-18: 09:00:00 20 mL via TOPICAL

## 2019-07-18 MED ORDER — THROMBIN 5000 UNITS EX SOLR
OROMUCOSAL | Status: DC | PRN
  Administered 2019-07-18 (×2): 5 mL via TOPICAL

## 2019-07-18 MED ORDER — ONDANSETRON HCL 4 MG/2ML IJ SOLN
INTRAMUSCULAR | Status: AC
  Filled 2019-07-18: qty 2

## 2019-07-18 MED ORDER — FENTANYL CITRATE (PF) 250 MCG/5ML IJ SOLN
INTRAMUSCULAR | Status: DC | PRN
  Administered 2019-07-18: 100 ug via INTRAVENOUS
  Administered 2019-07-18 (×2): 50 ug via INTRAVENOUS

## 2019-07-18 MED ORDER — OXYCODONE HCL 5 MG PO TABS: 5.0000 mg | ORAL_TABLET | Freq: Once | ORAL | Status: DC | PRN

## 2019-07-18 MED ORDER — HYDROCODONE-ACETAMINOPHEN 5-325 MG PO TABS: 1.0000 | ORAL_TABLET | ORAL | Status: DC | PRN

## 2019-07-18 MED ORDER — 0.9 % SODIUM CHLORIDE (POUR BTL) OPTIME
TOPICAL | Status: DC | PRN
  Administered 2019-07-18: 3000 mL

## 2019-07-18 MED ORDER — PANTOPRAZOLE SODIUM 40 MG IV SOLR
40.0000 mg | Freq: Every day | INTRAVENOUS | Status: DC
  Administered 2019-07-18 – 2019-07-21 (×4): 40 mg via INTRAVENOUS
  Filled 2019-07-18 (×4): qty 40

## 2019-07-18 MED ORDER — FLEET ENEMA 7-19 GM/118ML RE ENEM: 1.0000 | ENEMA | Freq: Once | RECTAL | Status: DC | PRN

## 2019-07-18 MED ORDER — PROPOFOL 10 MG/ML IV BOLUS
INTRAVENOUS | Status: AC
  Filled 2019-07-18: qty 40

## 2019-07-18 MED ORDER — ALBUMIN HUMAN 5 % IV SOLN: INTRAVENOUS | Status: DC | PRN

## 2019-07-18 MED ORDER — PHENYLEPHRINE HCL (PRESSORS) 10 MG/ML IV SOLN
INTRAVENOUS | Status: AC
  Filled 2019-07-18: qty 1

## 2019-07-18 MED ORDER — SODIUM CHLORIDE (PF) 0.9 % IJ SOLN
INTRAMUSCULAR | Status: AC
  Filled 2019-07-18: qty 20

## 2019-07-18 MED ORDER — BUPIVACAINE HCL 0.5 % IJ SOLN
INTRAMUSCULAR | Status: DC | PRN
  Administered 2019-07-18: 7.5 mL

## 2019-07-18 MED ORDER — CHLORHEXIDINE GLUCONATE CLOTH 2 % EX PADS: 6.0000 | MEDICATED_PAD | Freq: Once | CUTANEOUS | Status: DC

## 2019-07-18 MED ORDER — PHENYLEPHRINE HCL-NACL 10-0.9 MG/250ML-% IV SOLN
INTRAVENOUS | Status: DC | PRN
  Administered 2019-07-18: 25 ug/min via INTRAVENOUS

## 2019-07-18 MED ORDER — FENTANYL CITRATE (PF) 250 MCG/5ML IJ SOLN
INTRAMUSCULAR | Status: AC
  Filled 2019-07-18: qty 5

## 2019-07-18 MED ORDER — ONDANSETRON HCL 4 MG/2ML IJ SOLN
INTRAMUSCULAR | Status: DC | PRN
  Administered 2019-07-18: 4 mg via INTRAVENOUS

## 2019-07-18 MED ORDER — ONDANSETRON HCL 4 MG PO TABS: 4.0000 mg | ORAL_TABLET | ORAL | Status: DC | PRN

## 2019-07-18 MED ORDER — ONDANSETRON HCL 4 MG/2ML IJ SOLN: 4.0000 mg | Freq: Once | INTRAMUSCULAR | Status: DC | PRN

## 2019-07-18 MED ORDER — ACETAMINOPHEN 650 MG RE SUPP
650.0000 mg | RECTAL | Status: DC | PRN
  Administered 2019-07-21: 650 mg via RECTAL
  Filled 2019-07-18 (×2): qty 1

## 2019-07-18 MED ORDER — PROPOFOL 10 MG/ML IV BOLUS
INTRAVENOUS | Status: DC | PRN
  Administered 2019-07-18: 100 mg via INTRAVENOUS
  Administered 2019-07-18: 40 mg via INTRAVENOUS

## 2019-07-18 MED ORDER — POLYETHYLENE GLYCOL 3350 17 G PO PACK: 17.0000 g | PACK | Freq: Every day | ORAL | Status: DC | PRN

## 2019-07-18 MED ORDER — SODIUM CHLORIDE 0.9 % IV SOLN: INTRAVENOUS | Status: DC

## 2019-07-18 MED ORDER — VASOPRESSIN 20 UNIT/ML IV SOLN
INTRAVENOUS | Status: AC
  Filled 2019-07-18: qty 1

## 2019-07-18 MED ORDER — VANCOMYCIN HCL IN DEXTROSE 1-5 GM/200ML-% IV SOLN
1000.0000 mg | Freq: Once | INTRAVENOUS | Status: AC
  Administered 2019-07-18: 1000 mg via INTRAVENOUS
  Filled 2019-07-18: qty 200

## 2019-07-18 MED ORDER — MIDAZOLAM HCL 2 MG/2ML IJ SOLN
INTRAMUSCULAR | Status: AC
  Filled 2019-07-18: qty 2

## 2019-07-18 MED ORDER — HEMOSTATIC AGENTS (NO CHARGE) OPTIME
TOPICAL | Status: DC | PRN
  Administered 2019-07-18: 1 via TOPICAL

## 2019-07-18 MED ORDER — SODIUM CHLORIDE 0.9 % IV SOLN: INTRAVENOUS | Status: DC | PRN

## 2019-07-18 MED ORDER — ALPRAZOLAM 0.5 MG PO TABS
0.5000 mg | ORAL_TABLET | Freq: Two times a day (BID) | ORAL | Status: DC | PRN
  Administered 2019-07-19 – 2019-07-21 (×3): 0.5 mg via ORAL
  Filled 2019-07-18 (×4): qty 1

## 2019-07-18 MED ORDER — SENNA 8.6 MG PO TABS
1.0000 | ORAL_TABLET | Freq: Two times a day (BID) | ORAL | Status: DC
  Filled 2019-07-18 (×4): qty 1

## 2019-07-18 MED ORDER — THROMBIN 20000 UNITS EX KIT
PACK | CUTANEOUS | Status: AC
  Filled 2019-07-18: qty 1

## 2019-07-18 MED ORDER — SODIUM CHLORIDE 0.9 % IV SOLN: 10.0000 mL/h | Freq: Once | INTRAVENOUS | Status: AC

## 2019-07-18 MED ORDER — LIDOCAINE HCL (PF) 1 % IJ SOLN
INTRAMUSCULAR | Status: DC | PRN
  Administered 2019-07-18: 7.5 mL

## 2019-07-18 MED ORDER — HYDROMORPHONE HCL 1 MG/ML IJ SOLN
0.5000 mg | INTRAMUSCULAR | Status: DC | PRN
  Administered 2019-07-18: 0.5 mg via INTRAVENOUS
  Filled 2019-07-18: qty 1

## 2019-07-18 MED ORDER — DEXAMETHASONE SODIUM PHOSPHATE 10 MG/ML IJ SOLN
INTRAMUSCULAR | Status: DC | PRN
  Administered 2019-07-18: 10 mg via INTRAVENOUS

## 2019-07-18 MED ORDER — SODIUM CHLORIDE 0.9 % IV SOLN
INTRAVENOUS | Status: DC | PRN
  Administered 2019-07-18: 09:00:00 500 mL

## 2019-07-18 MED ORDER — BUPIVACAINE HCL (PF) 0.5 % IJ SOLN
INTRAMUSCULAR | Status: AC
  Filled 2019-07-18: qty 30

## 2019-07-18 MED ORDER — EPHEDRINE 5 MG/ML INJ
INTRAVENOUS | Status: AC
  Filled 2019-07-18: qty 10

## 2019-07-18 MED ORDER — THROMBIN 20000 UNITS EX SOLR
CUTANEOUS | Status: AC
  Filled 2019-07-18: qty 20000

## 2019-07-18 MED ORDER — SODIUM CHLORIDE 0.9 % IV SOLN
0.0125 ug/kg/min | INTRAVENOUS | Status: AC
  Administered 2019-07-18: .05 ug/kg/min via INTRAVENOUS
  Filled 2019-07-18: qty 2000

## 2019-07-18 MED ORDER — BACITRACIN ZINC 500 UNIT/GM EX OINT
TOPICAL_OINTMENT | CUTANEOUS | Status: AC
  Filled 2019-07-18: qty 28.35

## 2019-07-18 MED ORDER — FENTANYL CITRATE (PF) 100 MCG/2ML IJ SOLN: 25.0000 ug | INTRAMUSCULAR | Status: DC | PRN

## 2019-07-18 MED ORDER — PHENYLEPHRINE 40 MCG/ML (10ML) SYRINGE FOR IV PUSH (FOR BLOOD PRESSURE SUPPORT)
PREFILLED_SYRINGE | INTRAVENOUS | Status: DC | PRN
  Administered 2019-07-18 (×3): 120 ug via INTRAVENOUS

## 2019-07-18 MED ORDER — BACITRACIN ZINC 500 UNIT/GM EX OINT
TOPICAL_OINTMENT | CUTANEOUS | Status: DC | PRN
  Administered 2019-07-18 (×2): 1 via TOPICAL

## 2019-07-18 SURGICAL SUPPLY — 98 items
BAND RUBBER #18 3X1/16 STRL (MISCELLANEOUS) ×8 IMPLANT
BENZOIN TINCTURE PRP APPL 2/3 (GAUZE/BANDAGES/DRESSINGS) IMPLANT
BIT DRILL WIRE PASS 1.3MM (BIT) IMPLANT
BLADE 11 SAFETY STRL DISP (BLADE) ×2 IMPLANT
BLADE SAW GIGLI 16 STRL (MISCELLANEOUS) IMPLANT
BLADE SURG 15 STRL LF DISP TIS (BLADE) ×2 IMPLANT
BLADE SURG 15 STRL SS (BLADE) ×2
BLADE ULTRA TIP 2M (BLADE) IMPLANT
BNDG GAUZE ELAST 4 BULKY (GAUZE/BANDAGES/DRESSINGS) ×4 IMPLANT
BNDG STRETCH 4X75 STRL LF (GAUZE/BANDAGES/DRESSINGS) ×2 IMPLANT
BUR ACORN 6.0 PRECISION (BURR) ×3 IMPLANT
BUR ACORN 6.0MM PRECISION (BURR) ×1
BUR MATCHSTICK NEURO 3.0 LAGG (BURR) IMPLANT
BUR ROUND FLUTED 4 SOFT TCH (BURR) ×1 IMPLANT
BUR ROUND FLUTED 4MM SOFT TCH (BURR) ×1
BUR SPIRAL ROUTER 2.3 (BUR) ×1 IMPLANT
BUR SPIRAL ROUTER 2.3MM (BUR) ×1
CABLE BIPOLOR RESECTION CORD (MISCELLANEOUS) ×4 IMPLANT
CANISTER SUCT 3000ML PPV (MISCELLANEOUS) ×4 IMPLANT
CARTRIDGE OIL MAESTRO DRILL (MISCELLANEOUS) ×2 IMPLANT
CLIP ANEURY TI PERM STD STR 11 (Clip) ×4 IMPLANT
CLIP ANEURY TI PERM STD STR 9M (Clip) ×4 IMPLANT
CLIP VESOCCLUDE MED 6/CT (CLIP) IMPLANT
CONT SPEC 4OZ CLIKSEAL STRL BL (MISCELLANEOUS) ×2 IMPLANT
COVER WAND RF STERILE (DRAPES) ×4 IMPLANT
DECANTER SPIKE VIAL GLASS SM (MISCELLANEOUS) ×4 IMPLANT
DIFFUSER DRILL AIR PNEUMATIC (MISCELLANEOUS) ×4 IMPLANT
DRAPE MICROSCOPE LEICA (MISCELLANEOUS) ×4 IMPLANT
DRAPE NEUROLOGICAL W/INCISE (DRAPES) ×4 IMPLANT
DRAPE WARM FLUID 44X44 (DRAPES) ×4 IMPLANT
DRILL WIRE PASS 1.3MM (BIT)
DRSG ADAPTIC 3X8 NADH LF (GAUZE/BANDAGES/DRESSINGS) ×4 IMPLANT
DRSG TELFA 3X8 NADH (GAUZE/BANDAGES/DRESSINGS) ×4 IMPLANT
DURAPREP 6ML APPLICATOR 50/CS (WOUND CARE) ×4 IMPLANT
ELECT REM PT RETURN 9FT ADLT (ELECTROSURGICAL) ×4
ELECTRODE REM PT RTRN 9FT ADLT (ELECTROSURGICAL) ×2 IMPLANT
EVACUATOR SILICONE 100CC (DRAIN) IMPLANT
FORCEPS BIPO MALIS IRRIG 9X1.5 (NEUROSURGERY SUPPLIES) ×2 IMPLANT
FORCEPS BIPOLAR SPETZLER 8 1.0 (NEUROSURGERY SUPPLIES) IMPLANT
FORCEPS MALIS IRRIG 1.5 (NEUROSURGERY SUPPLIES) ×2 IMPLANT
GAUZE 4X4 16PLY RFD (DISPOSABLE) IMPLANT
GAUZE SPONGE 4X4 12PLY STRL (GAUZE/BANDAGES/DRESSINGS) ×2 IMPLANT
GLOVE BIO SURGEON STRL SZ7.5 (GLOVE) IMPLANT
GLOVE BIOGEL PI IND STRL 7.0 (GLOVE) IMPLANT
GLOVE BIOGEL PI IND STRL 7.5 (GLOVE) ×4 IMPLANT
GLOVE BIOGEL PI INDICATOR 7.0 (GLOVE)
GLOVE BIOGEL PI INDICATOR 7.5 (GLOVE) ×4
GLOVE ECLIPSE 7.0 STRL STRAW (GLOVE) ×8 IMPLANT
GLOVE EXAM NITRILE XL STR (GLOVE) IMPLANT
GOWN STRL REUS W/ TWL LRG LVL3 (GOWN DISPOSABLE) ×4 IMPLANT
GOWN STRL REUS W/ TWL XL LVL3 (GOWN DISPOSABLE) IMPLANT
GOWN STRL REUS W/TWL 2XL LVL3 (GOWN DISPOSABLE) IMPLANT
GOWN STRL REUS W/TWL LRG LVL3 (GOWN DISPOSABLE) ×4
GOWN STRL REUS W/TWL XL LVL3 (GOWN DISPOSABLE)
HEMOSTAT POWDER KIT SURGIFOAM (HEMOSTASIS) ×6 IMPLANT
HEMOSTAT SURGICEL 2X14 (HEMOSTASIS) ×4 IMPLANT
HOOK DURA (MISCELLANEOUS) ×4 IMPLANT
IV NS 1000ML (IV SOLUTION) ×2
IV NS 1000ML BAXH (IV SOLUTION) IMPLANT
KIT BASIN OR (CUSTOM PROCEDURE TRAY) ×4 IMPLANT
KIT DRAIN CSF ACCUDRAIN (MISCELLANEOUS) IMPLANT
KIT TURNOVER KIT B (KITS) ×4 IMPLANT
KNIFE ARACHNOID DISP AM-24-S (MISCELLANEOUS) ×2 IMPLANT
MARKER SPHERE PSV REFLC 13MM (MARKER) ×8 IMPLANT
NEEDLE HYPO 22GX1.5 SAFETY (NEEDLE) ×4 IMPLANT
NS IRRIG 1000ML POUR BTL (IV SOLUTION) ×8 IMPLANT
OIL CARTRIDGE MAESTRO DRILL (MISCELLANEOUS) ×4
PACK CRANIOTOMY CUSTOM (CUSTOM PROCEDURE TRAY) ×4 IMPLANT
PAD ARMBOARD 7.5X6 YLW CONV (MISCELLANEOUS) ×4 IMPLANT
PAD DRESSING TELFA 3X8 NADH (GAUZE/BANDAGES/DRESSINGS) ×2 IMPLANT
PATTIES SURGICAL .25X.25 (GAUZE/BANDAGES/DRESSINGS) IMPLANT
PATTIES SURGICAL .5 X.5 (GAUZE/BANDAGES/DRESSINGS) ×4 IMPLANT
PATTIES SURGICAL .5 X3 (DISPOSABLE) ×2 IMPLANT
PATTIES SURGICAL 1/4 X 3 (GAUZE/BANDAGES/DRESSINGS) IMPLANT
PATTIES SURGICAL 1X1 (DISPOSABLE) ×2 IMPLANT
PIN MAYFIELD SKULL DISP (PIN) ×2 IMPLANT
PLATE BONE 12 2H TARGET XL (Plate) ×4 IMPLANT
PLATE CRANIAL 4H UNI NEURO III (Plate) ×4 IMPLANT
SCREW UNIII AXS SD 1.5X4 (Screw) ×18 IMPLANT
SPONGE NEURO XRAY DETECT 1X3 (DISPOSABLE) IMPLANT
SPONGE SURGIFOAM ABS GEL 100 (HEMOSTASIS) ×6 IMPLANT
SPONGE SURGIFOAM ABS GEL 100C (HEMOSTASIS) IMPLANT
STAPLER VISISTAT 35W (STAPLE) ×6 IMPLANT
STOCKINETTE 6  STRL (DRAPES) ×2
STOCKINETTE 6 STRL (DRAPES) ×2 IMPLANT
SUT ETHILON 3 0 FSL (SUTURE) IMPLANT
SUT NURALON 4 0 TR CR/8 (SUTURE) ×8 IMPLANT
SUT VIC AB 0 CT1 18XCR BRD8 (SUTURE) ×4 IMPLANT
SUT VIC AB 0 CT1 8-18 (SUTURE) ×4
SUT VIC AB 3-0 SH 8-18 (SUTURE) ×10 IMPLANT
TAPE CLOTH 1X10 TAN NS (GAUZE/BANDAGES/DRESSINGS) ×4 IMPLANT
TOWEL GREEN STERILE (TOWEL DISPOSABLE) ×4 IMPLANT
TOWEL GREEN STERILE FF (TOWEL DISPOSABLE) ×4 IMPLANT
TRAY FOLEY MTR SLVR 16FR STAT (SET/KITS/TRAYS/PACK) IMPLANT
TUBE CONNECTING 20'X1/4 (TUBING) ×3
TUBE CONNECTING 20X1/4 (TUBING) ×3 IMPLANT
UNDERPAD 30X30 (UNDERPADS AND DIAPERS) IMPLANT
WATER STERILE IRR 1000ML POUR (IV SOLUTION) ×4 IMPLANT

## 2019-07-18 NOTE — Transfer of Care (Signed)
Immediate Anesthesia Transfer of Care Note  Patient: Sara Adams  Procedure(s) Performed: RIGHT CRANIOTOMY FOR RESECTION OF INTRACRANIAL ARTERIO-VENOUS MALFORMATION (Right ) APPLICATION OF CRANIAL NAVIGATION (N/A )  Patient Location: PACU  Anesthesia Type:General  Level of Consciousness: awake, alert   Airway & Oxygen Therapy: Patient Spontanous Breathing and Patient connected to face mask oxygen  Post-op Assessment: Report given to RN and Post -op Vital signs reviewed and stable  Post vital signs: Reviewed and stable  Last Vitals:  Vitals Value Taken Time  BP 108/68 07/10/2019 1438  Temp 37.7 C 07/17/2019 1423  Pulse 100 07/26/2019 1440  Resp 20 07/17/2019 1440  SpO2 94 % 07/09/2019 1440  Vitals shown include unvalidated device data.  Last Pain:  Vitals:   07/25/2019 1423  TempSrc:   PainSc: Asleep         Complications: No apparent anesthesia complications

## 2019-07-18 NOTE — Anesthesia Procedure Notes (Signed)
Arterial Line Insertion Performed by: Lucretia Kern, MD, anesthesiologist  Preanesthetic checklist: patient identified, risks and benefits discussed and surgical consent Lidocaine 1% used for infiltration Right, radial was placed Catheter size: 20 G Hand hygiene performed  and Seldinger technique used  Attempts: 1 Procedure performed using ultrasound guided technique. Ultrasound Notes:image(s) printed for medical record Following insertion, dressing applied and Biopatch. Post procedure assessment: normal  Patient tolerated the procedure well with no immediate complications.

## 2019-07-18 NOTE — Op Note (Signed)
NEUROSURGERY OPERATIVE NOTE   PREOP DIAGNOSIS:  1. Right temporal AVM   POSTOP DIAGNOSIS: Same  PROCEDURE: 1. Stereotactic right frontotemporal craniotomy for resection of arteriovenous malformation 2. Use of intraoperative use of intraoperative microscope for microdissection  SURGEON: Dr. Lisbeth Renshaw, MD  ASSISTANT: Dr. Coletta Memos, MD  ANESTHESIA: General Endotracheal  EBL: 800cc  SPECIMENS: None  DRAINS: None  COMPLICATIONS: None immediate  CONDITION: Hemodynamically stable to PACU  HISTORY: Sara Adams is a 74 y.o. female initially presented to the outpatient neurosurgery clinic after she has had multiple episodes of transient left arm weakness and facial droop.  Work-up included MRI which demonstrated the likelihood of a left temporal arteriovenous malformation.  She further underwent diagnostic cerebral angiogram confirming the presence of a left temporal arteriovenous malformation.  With her symptoms suspicious for focal motor seizure and the location and size of the AVM, treatment was indicated.  Treatment options were discussed in detail with the patient and her family.  After all questions were answered, she elected to proceed with surgical resection.  PROCEDURE IN DETAIL: The patient was brought to the operating room. After induction of general anesthesia, the patient was positioned on the operative table in the Mayfield head holder in the supine position. All pressure points were meticulously padded.  Using the preoperative stereotactic MRI scan which was fused with the preoperative angiogram, surface markers were used to call register until a satisfactory accuracy was achieved.  Stereotactic system was then used to plan out a frontotemporal craniotomy to allow access to the entire AVM.  Skin incision was then marked out and prepped and draped in the usual sterile fashion.  After timeout was conducted, the incision was infiltrated with local anesthetic with  epinephrine.  Incision was then made sharply and carried down through the galea.  The superficial temporal fascia and fat pad were incised until the deep temporal fascia was identified.  Skin flap with the temporal fat pad was then elevated and retracted anteriorly.  The temporalis muscle was then incised just inferior to the superior temporal line.  The temporalis muscle was then elevated and reflected inferiorly.  Stereotactic system was again used to ensure access to the temporal pole, the entire sylvian fissure, and the floor of the middle cranial fossa.  Multiple multiple bur holes were then created multiple bur holes were then created and connected with a craniotome, and the bone flap was elevated.  Hemostasis on the epidural surface was secured with bipolar electrocautery and morselized Gelfoam and thrombin.  High-speed drill was then used to drill down the lesser wing of the sphenoid.  Hemostasis on the sphenoid bone was achieved primarily with bone wax.  I did note a significant amount of bleeding from the bone likely related to venous drainage of the AVM.  At this point the dura was opened in a curvilinear fashion based anteriorly.  4 Nurolon tack up stitches were placed.  Care was taken to avoid injury to the underlying arteriovenous malformation.  Once the dura was elevated, the AVM was clearly visible on the inferior margin of the sylvian fissure extending towards the temporal pole.  Initially, the sylvian fissure was dissected in order to identify the supraclinoid segment of the internal carotid artery.  Attention was then turned to the superior temporal gyrus near the temporal pole.  Small draining veins into the sphenoparietal sinus were coagulated and cut.  Corticectomy was then made on the midportion of the superior temporal gyrus, and the white matter was dissected using  a combination of suction and bipolar electrocautery.  In this way, the inferior margin of the arteriovenous malformation  patient was dissected out moving from the temporal pole posteriorly posteriorly until the large draining vein was identified.  At this point, dissection was carried more medially until the pia abutting the sylvian fissure was identified.  At this point, attention was again turned to the sylvian fissure.  With the arteriovenous malformation dissected anteriorly, we were able to identify the middle cerebral artery bifurcation including a large anterior temporal branch which appeared to course directly into the arteriovenous malformation.  An aneurysm clip was therefore placed across the anterior temporal branch.  This was noted to significantly soften the arteriovenous malformation, and the large draining vein at the posterior margin was noted to become much more venous colored.  Softening of the arteriovenous malformation therefore then allowed dissection of the peel margin posteriorly.  This required clipping and ligation of inferiorly directed draining veins.  Immediately underneath the draining vein we noted a relatively large arterial pedicle.  This was coagulated and cut.  This allowed further dissection of the arteriovenous malformation towards the sylvian fissure.  Finally, large arterial feeders emanating from the proximal portion of the hypertrophic M2 segment were identified, coagulated, and divided.  This allowed removal of the arteriovenous malformation after the large anterior temporal feeder was cut.  The AVM was sent for permanent pathology.    The resection cavity was then inspected, and no further arteriovenous malformation was identified.  Hemostasis was easily secured with morselized Gelfoam and thrombin.  The resection bed was then covered with a layer of Surgicel.  The wound was then irrigated with copious amounts of normal saline irrigation.  There is no active bleeding identified.  Dura was then reapproximated with interrupted 0 Vicryl stitches.  The dura was covered with a large piece of  Gelfoam.  The bone flap was replaced and plated with standard titanium plates and screws.  Temporalis muscle was then reapproximated with interrupted 0 Vicryl stitches.  The galea was approximated with interrupted 0 and 3-0 Vicryl stitches and the skin was closed with staples.  Sterile dressing with bacitracin ointment was then applied.  Head wrap was placed after the Mayfield head holder was removed.  The patient was then extubated, transferred to the stretcher, and taken to the postanesthesia care unit in stable hemodynamic condition.  At the end of the case all sponge, needle, instrument, and cottonoid counts were correct.

## 2019-07-18 NOTE — Anesthesia Postprocedure Evaluation (Signed)
Anesthesia Post Note  Patient: Sara Adams  Procedure(s) Performed: RIGHT CRANIOTOMY FOR RESECTION OF INTRACRANIAL ARTERIO-VENOUS MALFORMATION (Right ) APPLICATION OF CRANIAL NAVIGATION (N/A )     Patient location during evaluation: PACU Anesthesia Type: General Level of consciousness: awake and alert Pain management: pain level controlled Vital Signs Assessment: post-procedure vital signs reviewed and stable Respiratory status: spontaneous breathing, nonlabored ventilation and respiratory function stable Cardiovascular status: blood pressure returned to baseline and stable Postop Assessment: no apparent nausea or vomiting Anesthetic complications: no    Last Vitals:  Vitals:   07/13/2019 1453 07/24/2019 1458  BP: 115/71 106/68  Pulse: 95 93  Resp: (!) 24 20  Temp: 37.7 C   SpO2: 94% 94%    Last Pain:  Vitals:   07/04/2019 1423  TempSrc:   PainSc: Asleep                 Lucretia Kern

## 2019-07-18 NOTE — Anesthesia Procedure Notes (Signed)
Procedure Name: Intubation Date/Time: 06/26/2019 8:04 AM Performed by: Alain Marion, CRNA Pre-anesthesia Checklist: Patient identified, Emergency Drugs available, Suction available and Patient being monitored Patient Re-evaluated:Patient Re-evaluated prior to induction Oxygen Delivery Method: Circle System Utilized Preoxygenation: Pre-oxygenation with 100% oxygen Induction Type: IV induction Ventilation: Mask ventilation without difficulty Laryngoscope Size: Mac and 3 Grade View: Grade I Tube type: Oral Tube size: 7.0 mm Number of attempts: 1 Airway Equipment and Method: Stylet Placement Confirmation: ETT inserted through vocal cords under direct vision,  positive ETCO2 and breath sounds checked- equal and bilateral Secured at: 22 cm Tube secured with: Tape Dental Injury: Teeth and Oropharynx as per pre-operative assessment

## 2019-07-18 NOTE — Progress Notes (Signed)
Pharmacy Antibiotic Note  Sara Adams is a 74 y.o. female admitted on 07-19-19 with R temporal AVM.  Pt is S/P neurosurgery this afternoon. Pharmacy has been consulted for vancomycin dosing for surgical prophylaxis (24 hrs) in this pt with hx of penicillin allergy.  Pt rec'd vancomycin 1 gm IV X 1 pre op at 08:10 AM today. Pt has no drains in place post op.  WBC 5.6, afebrile; Scr 0.37, CrCl 57.8 ml/min  Plan: Vancomycin 1 gm IV X 1 at 20:00 PM today (~12 hrs after pre op vancomycin dose)  Height: 5\' 6"  (167.6 cm) Weight: 145 lb (65.8 kg) IBW/kg (Calculated) : 59.3  Temp (24hrs), Avg:99.4 F (37.4 C), Min:98.4 F (36.9 C), Max:99.9 F (37.7 C)  Recent Labs  Lab 2019-07-19 0648  WBC 5.6  CREATININE 0.37*    Estimated Creatinine Clearance: 57.8 mL/min (A) (by C-G formula based on SCr of 0.37 mg/dL (L)).    Allergies  Allergen Reactions  . Asa [Aspirin]     UNSPECIFIED REACTION   . Codeine     UNSPECIFIED REACTION   . Penicillins     UNSPECIFIED REACTION   . Sulfa Antibiotics     UNSPECIFIED REACTION     Microbiology results: 3/20 COVID: negative  Thank you for allowing pharmacy to be a part of this patient's care.  4/20, PharmD, BCPS, [facility] 19-Jul-2019 3:29 PM

## 2019-07-19 ENCOUNTER — Inpatient Hospital Stay (HOSPITAL_COMMUNITY): Payer: Medicare HMO

## 2019-07-19 ENCOUNTER — Inpatient Hospital Stay (HOSPITAL_COMMUNITY)
Admission: RE | Admit: 2019-07-19 | Discharge: 2019-07-19 | Disposition: A | Discharge status: Home / Self Care | Payer: Medicare HMO | Source: Ambulatory Visit | Attending: Neurosurgery | Admitting: Neurosurgery

## 2019-07-19 DIAGNOSIS — Q282 Arteriovenous malformation of cerebral vessels: Secondary | ICD-10-CM

## 2019-07-19 DIAGNOSIS — R569 Unspecified convulsions: Secondary | ICD-10-CM

## 2019-07-19 DIAGNOSIS — R259 Unspecified abnormal involuntary movements: Secondary | ICD-10-CM

## 2019-07-19 DIAGNOSIS — I700 Atherosclerosis of aorta: Secondary | ICD-10-CM | POA: Diagnosis present

## 2019-07-19 HISTORY — PX: IR ANGIO INTRA EXTRACRAN SEL COM CAROTID INNOMINATE UNI R MOD SED: IMG5359

## 2019-07-19 LAB — BASIC METABOLIC PANEL
Anion gap: 9 (ref 5–15)
BUN: 5 mg/dL — ABNORMAL LOW (ref 8–23)
CO2: 23 mmol/L (ref 22–32)
Calcium: 7.9 mg/dL — ABNORMAL LOW (ref 8.9–10.3)
Chloride: 109 mmol/L (ref 98–111)
Creatinine, Ser: 0.43 mg/dL — ABNORMAL LOW (ref 0.44–1.00)
GFR calc Af Amer: >60 mL/min (ref >60)
GFR calc non Af Amer: >60 mL/min (ref >60)
Glucose, Bld: 125 mg/dL — ABNORMAL HIGH (ref 70–99)
Potassium: 3.3 mmol/L — ABNORMAL LOW (ref 3.5–5.1)
Sodium: 141 mmol/L (ref 135–145)

## 2019-07-19 LAB — SURGICAL PATHOLOGY

## 2019-07-19 LAB — CBC
HCT: 27.4 % — ABNORMAL LOW (ref 36.0–46.0)
Hemoglobin: 9.1 g/dL — ABNORMAL LOW (ref 12.0–15.0)
MCH: 31.6 pg (ref 26.0–34.0)
MCHC: 33.2 g/dL (ref 30.0–36.0)
MCV: 95.1 fL (ref 80.0–100.0)
Platelets: 216 10*3/uL (ref 150–400)
RBC: 2.88 MIL/uL — ABNORMAL LOW (ref 3.87–5.11)
RDW: 12.2 % (ref 11.5–15.5)
WBC: 13.9 10*3/uL — ABNORMAL HIGH (ref 4.0–10.5)
nRBC: 0 % (ref 0.0–0.2)

## 2019-07-19 MED ORDER — ALBUMIN HUMAN 25 % IV SOLN: 25.0000 g | Freq: Once | INTRAVENOUS | Status: DC

## 2019-07-19 MED ORDER — ALBUMIN HUMAN 5 % IV SOLN
INTRAVENOUS | Status: AC
  Administered 2019-07-19: 12.5 g via INTRAVENOUS
  Filled 2019-07-19: qty 250

## 2019-07-19 MED ORDER — MIDAZOLAM HCL 2 MG/2ML IJ SOLN
INTRAMUSCULAR | Status: AC
  Filled 2019-07-19: qty 2

## 2019-07-19 MED ORDER — POTASSIUM CHLORIDE 10 MEQ/100ML IV SOLN
10.0000 meq | INTRAVENOUS | Status: AC
  Administered 2019-07-19 (×2): 10 meq via INTRAVENOUS
  Filled 2019-07-19: qty 100

## 2019-07-19 MED ORDER — LIDOCAINE HCL (PF) 1 % IJ SOLN
INTRAMUSCULAR | Status: DC | PRN
  Administered 2019-07-19: 5 mL

## 2019-07-19 MED ORDER — LIDOCAINE HCL 1 % IJ SOLN
INTRAMUSCULAR | Status: AC
  Filled 2019-07-19: qty 20

## 2019-07-19 MED ORDER — FENTANYL CITRATE (PF) 100 MCG/2ML IJ SOLN
INTRAMUSCULAR | Status: DC | PRN
  Administered 2019-07-19: 12.5 ug via INTRAVENOUS

## 2019-07-19 MED ORDER — MIDAZOLAM HCL 2 MG/2ML IJ SOLN
INTRAMUSCULAR | Status: DC | PRN
  Administered 2019-07-19: 0.5 mg via INTRAVENOUS

## 2019-07-19 MED ORDER — IOHEXOL 300 MG/ML  SOLN
150.0000 mL | Freq: Once | INTRAMUSCULAR | Status: AC | PRN
  Administered 2019-07-19: 22 mL via INTRA_ARTERIAL

## 2019-07-19 MED ORDER — FENTANYL CITRATE (PF) 100 MCG/2ML IJ SOLN
INTRAMUSCULAR | Status: AC
  Filled 2019-07-19: qty 2

## 2019-07-19 MED ORDER — POTASSIUM CHLORIDE 10 MEQ/100ML IV SOLN
10.0000 meq | INTRAVENOUS | Status: AC
  Filled 2019-07-19: qty 100

## 2019-07-19 MED ORDER — ALBUMIN HUMAN 5 % IV SOLN: 12.5000 g | Freq: Once | INTRAVENOUS | Status: AC

## 2019-07-19 MED ORDER — SODIUM CHLORIDE 0.9 % IV SOLN: 200.0000 mg | Freq: Once | INTRAVENOUS | Status: DC

## 2019-07-19 MED FILL — Thrombin For Soln 5000 Unit: CUTANEOUS | Qty: 5000 | Status: AC

## 2019-07-19 NOTE — Procedures (Addendum)
Patient Name: Sara Adams  MRN: [id number]  Epilepsy Attending: Charlsie Quest  Referring Physician/Provider: Cindra Presume, PA Date: 07/19/2019  Duration: 33.11 mins  Patient history: 74yo F s/p R FT crani resection of AVM. Noted to have left facial and arm twitching. EEG to evaluate for seizure  Level of alertness: awake  AEDs during EEG study: versed  Technical aspects: This EEG study was done with scalp electrodes positioned according to the 10-20 International system of electrode placement. Electrical activity was acquired at a sampling rate of 500Hz  and reviewed with a high frequency filter of 70Hz  and a low frequency filter of 1Hz . EEG data were recorded continuously and digitally stored.   DESCRIPTION: No clear posterior dominant rhythm was seen. EEG showed continuous generalized, maximal right temporal region, 3-6hz  theta-delta slowing. Frequent sharp waves were seen in right posterior temporal region. At times the sharp waves appeared quasi-rhythmic without clear evolution. Clinically on video, patient was noted to have semi-rhythmic twitching of left side of face but only left side of face was visible on video. Also patient had almost continuous chewing movements therefore difficult to know the duration of twitching episode.  Hyperventilation and photic stimulation were not performed.  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 There is also evidence of cortical dysfunction in right temporal region consistent with craniotomy as well as moderate diffuse encephalopathy, non specific to etiology.   Patient was noted to have semi rhythmic left facial twitching during the study at times. Concomitant eeg showed quasi-rhythmic right temporal slowing as well as  intermittent sharp waves in right posterior temporal region without clear evolution. However, given h/o craniotomy, this is concerning for focal motor seizure without alteration of awareness.   Dr was notified.   Vamsi Apfel 

## 2019-07-19 NOTE — Sedation Documentation (Signed)
Vital signs stable. Pt able to follow commands from Dr

## 2019-07-19 NOTE — Consult Note (Signed)
Neurology Consultation Reason for Consult: Abnormal movements Referring Physician: Norva Karvonen  CC: Abnormal movements  History is obtained from: Patient, son  HPI: Sara Adams is a 74 y.o. female admitted to the hospital for AVM resection.  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  Over the past few months, she has noted that she has intermittent abnormal facial twitching and left arm movements.  These are suppressible with effort, but they do not stay suppressed for very long.  When they occur, they would last for few hours at a time, but then improved.  They do not happen every day, but have been happening more frequently.  Today, she has been having the movements pretty constantly.  Nursing reports even when she was napping, they still seem to be present.  Of note she has been hypotensive, this is being followed by the primary team.  Currently she denies any symptoms of lightheadedness, etc.  ROS: A 14 point ROS was performed and is negative except as noted in the HPI.   Past Medical History:  Diagnosis Date  . Arthritis   . Bruises easily   . Cough   . Depression   . Frequent headaches   . Hearing loss   . Muscle pain   . Pneumonia   . Sinus problem   . Swelling      Family history: No history of unprovoked seizures, there is a family member who had hypoglycemic induced seizures.   Social History:  reports that she has been smoking cigarettes. She has been smoking about 1.00 pack per day. She has never used smokeless tobacco. She reports that she does not drink alcohol or use drugs.   Exam: Current vital signs: BP (!) 73/43   Pulse 84   Temp 99.8 F (37.7 C) (Axillary)   Resp (!) 22   Ht 5\' 6"  (1.676 m)   Wt 65.8 kg   SpO2 94%   BMI 23.40 kg/m  Vital signs in last 24 hours: Temp:  [98.5 F (36.9 C)-99.8 F (37.7 C)] 99.8 F (37.7 C) (03/24 1600) Pulse Rate:  [72-97] 84 (03/24  1900) Resp:  [15-32] 22 (03/24 1900) BP: (55-143)/(36-103) 73/43 (03/24 1900) SpO2:  [91 %-98 %] 94 % (03/24 1900) Arterial Line BP: (68-105)/(39-96) 105/54 (03/24 1015) Weight:  [65.8 kg] 65.8 kg (03/24 0800)   Physical Exam  Constitutional: Appears well-developed and well-nourished.  Psych: Affect appropriate to situation Eyes: No scleral injection HENT: No OP obstrucion MSK: no joint deformities.  Cardiovascular: Normal rate and regular rhythm.  Respiratory: Effort normal, non-labored breathing GI: Soft.  No distension. There is no tenderness.  Skin: WDI  Neuro: Mental Status: Patient is awake, alert, interactive and appropriate, oriented to month and year No signs of aphasia or neglect Cranial Nerves: II: Visual Fields are full. Pupils are equal, round, and reactive to light.   III,IV, VI: EOMI without ptosis or diploplia.  V: Facial sensation is symmetric to temperature VII: Facial movement is symmetric.  VIII: hearing is intact to voice X: Uvula elevates symmetrically XI: Shoulder shrug is symmetric. XII: tongue is midline without atrophy or fasciculations.  Motor: Tone is normal. Bulk is normal. 5/5 strength was present in all four extremities.  Sensory: Sensation is symmetric to light touch and temperature in the arms and legs. Cerebellar: FNF  intact bilaterally   I have reviewed labs in epic and the results pertinent to this consultation are: BMP-unremarkable  I have reviewed the images obtained: MRI brain-large right temporal AVM  Impression: 74 year old female with abnormal movements in the setting of a large right temporal AVM.  The suppressible nature, and noncharacteristic appearance would argue against an epileptiform nature to these movements.  The highly irritable location of the AVM, waxing and waning presence of the movements, and the presence of sharp waves on EEG does still give me pause, however.  Medications such as phenytoin would be  consideration, however with her current hypotension I would be hesitant to do so.  Vimpat could also be a consideration, she does not currently have an EKG in our system and therefore I will check one in case it needs to be started.  The appearance actually appears more typical of something like tardive dyskinesia, but the patient has not been on any medications twitches could be attributed.  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  At the current time, however, I would favor getting more information with continuous EEG.  If this is epileptic in nature, we may see intermittent spread which could be visible on surface EEG.  Recommendations: 1) overnight EEG 2) Neurology will continue to follow.    Ritta Slot, MD [facility] [phone]  If 7pm- 7am, please page neurology on call as listed in AMION.

## 2019-07-19 NOTE — Sedation Documentation (Signed)
Pt prepped on IR table at this time. VSS. Dr Conchita Paris notified about pt rapid, involuntary movements. Advised pt to try to stay as still as possible during procedure, she verbalized understanding but still continues to move and jerk arms/shoulders.

## 2019-07-19 NOTE — Progress Notes (Signed)
  NEUROSURGERY PROGRESS NOTE   Hypotension requiring continued titration of neo, addition of albumin. Patient asx. Complains of right sided headacge. Left facial twitching and involuntary movement of LUE persist. More exaggerated with rest.  EXAM:  BP (!) 92/56   Pulse 87   Temp 98.9 F (37.2 C) (Axillary)   Resp (!) 26   Ht 5\' 6"  (1.676 m)   Wt 65.8 kg   SpO2 91%   BMI 23.40 kg/m   Awake, alert, oriented  Speech fluent, appropriate  Left facial twitch, almost lip smacking behavior Right CN3 palsy MAEW, nonfocal. Involuntary movement of LUE Bandage in place  IMPRESSION/PLAN 74 y.o. female POD1 crani for resection of AVM. At baseline. - plan for angio later today - left facial twitching/involuntary movement LUE: though possible focal seizure preop. On keppra 500mg  BID. Will obtain EEG. Will consult neuro pending results. - hypotension: continue neo.

## 2019-07-19 NOTE — Brief Op Note (Signed)
  NEUROSURGERY BRIEF OPERATIVE  NOTE   PREOP DX: AVM  POSTOP DX: Same  PROCEDURE: Diagnostic cerebral angiogram  SURGEON: Dr. Lisbeth Renshaw, MD  ANESTHESIA: IV Sedation with Local  EBL: Minimal  SPECIMENS: None  COMPLICATIONS: None  CONDITION: Stable to recovery  FINDINGS (Full report in [facility]): 1. Complete resection of previously seen right temporal AVM with continued hypertrophic right ICA/MCA without any early venous drainage seen. 2. 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

## 2019-07-19 NOTE — Sedation Documentation (Signed)
Patient is resting comfortably. 

## 2019-07-19 NOTE — Progress Notes (Signed)
vLTM started  New leadsmused  Event button tested  neurology notified

## 2019-07-19 NOTE — Progress Notes (Signed)
OT Cancellation Note  Patient Details Name: Sara Adams MRN: [id number] DOB: [birth year]   Cancelled Treatment:    Reason Eval/Treat Not Completed: Medical issues which prohibited therapy; Patient continues with hypotension and high dose neosynephrine. Will follow for appropriateness for OT evaluation.   Marcy Siren, OT Acute Rehabilitation Services Pager [phone] Office [phone]   Orlando Penner 07/19/2019, 11:02 AM

## 2019-07-19 NOTE — Sedation Documentation (Signed)
Pt is alert and resting on table at this time

## 2019-07-19 NOTE — Progress Notes (Signed)
EEG complete - results pending 

## 2019-07-19 NOTE — Progress Notes (Signed)
Paged [facility] PA and Dr. Conchita Paris regarding SBP in 50's-60's. Patient is alert and oriented and not symptomatic. Orders to keep neo off and treat BP based on symptoms.

## 2019-07-19 NOTE — Progress Notes (Signed)
Patient still hypotensive with Neo maxed at . Dr. Conchita Paris at bedside. Patient is asymptomatic with hypotension. She states her blood pressure runs low but is unable to tell us how low. Orders to titrate neo down based on patients symptoms and tolerance. Orders to remove arterial line. BP cuff is correlating with arterial pressures.

## 2019-07-19 NOTE — Progress Notes (Signed)
PT Cancellation Note  Patient Details Name: Sara Adams MRN: [id number] DOB: 1945/07/20   Cancelled Treatment:    Reason Eval/Treat Not Completed: Medical issues which prohibited therapy  Patient continues with hypotension and high dose neosynephrine. Will follow for appropriateness for mobility/PT evaluation.   Jerolyn Center, PT Pager [phone]  Zena Amos 07/19/2019, 10:10 AM

## 2019-07-19 NOTE — Sedation Documentation (Signed)
Patient is resting comfortably. Dr aware of pt BP

## 2019-07-19 NOTE — Sedation Documentation (Signed)
Sheath removed, 60F exoseal in place. Groin level 0

## 2019-07-19 NOTE — Progress Notes (Signed)
Titrating up on pressor (Neo) throughout the night to keep MAP >65. Almost maxed out on pressor. Neuro status unchanged. Notified Maisie Fus- orders received to give Albumin. Will continue to monitor.

## 2019-07-19 NOTE — Sedation Documentation (Signed)
Pt tolerated procedure very well. Will assist pt back to bed and transport pt to 4N, report given to RN over phone and at bedside.

## 2019-07-20 ENCOUNTER — Encounter: Payer: Self-pay | Admitting: *Deleted

## 2019-07-20 MED ORDER — CHLORHEXIDINE GLUCONATE CLOTH 2 % EX PADS
6.0000 | MEDICATED_PAD | Freq: Every day | CUTANEOUS | Status: DC
  Administered 2019-07-20 – 2019-07-22 (×3): 6 via TOPICAL

## 2019-07-20 MED ORDER — NICOTINE 21 MG/24HR TD PT24
21.0000 mg | MEDICATED_PATCH | Freq: Every day | TRANSDERMAL | Status: DC
  Administered 2019-07-21: 21 mg via TRANSDERMAL
  Filled 2019-07-20: qty 1

## 2019-07-20 MED ORDER — LORAZEPAM 2 MG/ML IJ SOLN
1.0000 mg | Freq: Once | INTRAMUSCULAR | Status: AC
  Administered 2019-07-20: 1 mg via INTRAVENOUS
  Filled 2019-07-20: qty 1

## 2019-07-20 MED ORDER — LEVETIRACETAM IN NACL 500 MG/100ML IV SOLN
500.0000 mg | Freq: Two times a day (BID) | INTRAVENOUS | Status: DC
  Administered 2019-07-20 – 2019-07-21 (×2): 500 mg via INTRAVENOUS
  Filled 2019-07-20: qty 100

## 2019-07-20 MED ORDER — LEVETIRACETAM IN NACL 1500 MG/100ML IV SOLN
1500.0000 mg | Freq: Once | INTRAVENOUS | Status: AC
  Administered 2019-07-20: 1500 mg via INTRAVENOUS
  Filled 2019-07-20: qty 100

## 2019-07-20 MED ORDER — LEVETIRACETAM IN NACL 500 MG/100ML IV SOLN
500.0000 mg | Freq: Two times a day (BID) | INTRAVENOUS | Status: DC
  Filled 2019-07-20: qty 100

## 2019-07-20 NOTE — Progress Notes (Signed)
EEG maint complete. No skin breakdown at site FP1 Fp2 p3. Continue to monitor

## 2019-07-20 NOTE — Progress Notes (Signed)
Neurology Progress Note   S:// Seen and examined. Long-term EEG did definitely capture 1 subclinical seizure emanating from the right temporal lobe.  O:// Current vital signs: BP (!) 83/57 (BP Location: Right Arm)   Pulse 87   Temp 98.7 F (37.1 C) (Oral)   Resp (!) 21   Ht 5\' 6"  (1.676 m)   Wt 65.8 kg   SpO2 94%   BMI 23.40 kg/m  Vital signs in last 24 hours: Temp:  [98.4 F (36.9 C)-99.8 F (37.7 C)] 98.7 F (37.1 C) (03/25 0800) Pulse Rate:  [57-108] 87 (03/25 0800) Resp:  [20-32] 21 (03/25 0800) BP: (55-143)/(36-103) 83/57 (03/25 0800) SpO2:  [92 %-98 %] 94 % (03/25 0800) Arterial Line BP: (81-105)/(52-75) 105/54 (03/24 1015) Awake alert oriented x3 Rhythmic twitching of the left face. Right ptosis No aphasia Mild dysarthria Cranial nerves: Pupils equal round reactive to light, right ptosis, left facial twitching, face appears mildly asymmetric but difficult to ascertain what side is weak based on the fact that she has continuous twitching of the face. Motor exam: No focal weakness noted Sensory exam intact to touch Coordination: Left upper extremity does tend to have movements which appear hemiballistic and are volitionally able to stop but then when she is distracted she does have some spontaneous proximal shoulder involuntary movements.  Medications  Current Facility-Administered Medications:  .  0.9 %  sodium chloride infusion, , Intravenous, Continuous, Costella, 10-06-1985, PA-C, Last Rate: 75 mL/hr at 07/20/19 0600, Rate Verify at 07/20/19 0600 .  acetaminophen (TYLENOL) tablet 650 mg, 650 mg, Oral, Q4H PRN, 650 mg at 07/20/19 0615 **OR** acetaminophen (TYLENOL) suppository 650 mg, 650 mg, Rectal, Q4H PRN, Costella, 07/01/2019, PA-C .  ALPRAZolam Darci Current) tablet 0.5 mg, 0.5 mg, Oral, BID PRN, Costella, Prudy Feeler, PA-C, 0.5 mg at 07/19/19 2104 .  bisacodyl (DULCOLAX) EC tablet 5 mg, 5 mg, Oral, Daily PRN, Costella, Vincent J, PA-C .  6 CHG cloth bath night before  surgery, , , Once **AND** [COMPLETED] 6 CHG cloth bath AM of surgery, , , Once **AND** Chlorhexidine Gluconate Cloth 2 % PADS 6 each, 6 each, Topical, Once **AND** Chlorhexidine Gluconate Cloth 2 % PADS 6 each, 6 each, Topical, Once, Costella, Vincent J, PA-C .  6 CHG cloth bath night before surgery, , , Once **AND** [COMPLETED] 6 CHG cloth bath AM of surgery, , , Once **AND** Chlorhexidine Gluconate Cloth 2 % PADS 6 each, 6 each, Topical, Once **AND** Chlorhexidine Gluconate Cloth 2 % PADS 6 each, 6 each, Topical, Once, Costella, 2105, PA-C .  HYDROcodone-acetaminophen (NORCO/VICODIN) 5-325 MG per tablet 1 tablet, 1 tablet, Oral, Q4H PRN, Costella, [employer], PA-C .  HYDROmorphone (DILAUDID) injection 0.5-1 mg, 0.5-1 mg, Intravenous, Q2H PRN, Costella, Vincent J, PA-C, 0.5 mg at 07/03/2019 1533 .  labetalol (NORMODYNE) injection 10-40 mg, 10-40 mg, Intravenous, Q10 min PRN, Costella, 10-17-1996, PA-C .  naloxone (NARCAN) injection 0.08 mg, 0.08 mg, Intravenous, PRN, Costella, Vincent J, PA-C .  ondansetron (ZOFRAN) tablet 4 mg, 4 mg, Oral, Q4H PRN **OR** ondansetron (ZOFRAN) injection 4 mg, 4 mg, Intravenous, Q4H PRN, Costella, Vincent J, PA-C .  pantoprazole (PROTONIX) injection 40 mg, 40 mg, Intravenous, QHS, Costella, Vincent J, PA-C, 40 mg at 07/19/19 2104 .  phenylephrine (NEOSYNEPHRINE) 10-0.9 MG/250ML-% infusion, 0-400 mcg/min, Intravenous, Titrated, 2105, MD, Stopped at 07/19/19 1545 .  polyethylene glycol (MIRALAX / GLYCOLAX) packet 17 g, 17 g, Oral, Daily PRN, Costella, Vincent J, PA-C .  promethazine (PHENERGAN) tablet  12.5-25 mg, 12.5-25 mg, Oral, Q4H PRN, Costella, Vincent J, PA-C .  senna (SENOKOT) tablet 8.6 mg, 1 tablet, Oral, BID, Costella, Vincent J, PA-C .  sodium phosphate (FLEET) 7-19 GM/118ML enema 1 enema, 1 enema, Rectal, Once PRN, Traci Sermon, PA-C Labs CBC    Component Value Date/Time   WBC 13.9 (H) 07/19/2019 0458   RBC 2.88 (L) 07/19/2019 0458    HGB 9.1 (L) 07/19/2019 0458   HCT 27.4 (L) 07/19/2019 0458   PLT 216 07/19/2019 0458   MCV 95.1 07/19/2019 0458   MCH 31.6 07/19/2019 0458   MCHC 33.2 07/19/2019 0458   RDW 12.2 07/19/2019 0458   LYMPHSABS 2.1 07/17/2019 0648   MONOABS 0.4 07/26/2019 0648   EOSABS 0.2 06/27/2019 0648   BASOSABS 0.1 07/14/2019 0648    CMP     Component Value Date/Time   NA 141 07/19/2019 0458   K 3.3 (L) 07/19/2019 0458   CL 109 07/19/2019 0458   CO2 23 07/19/2019 0458   GLUCOSE 125 (H) 07/19/2019 0458   BUN 5 (L) 07/19/2019 0458   CREATININE 0.43 (L) 07/19/2019 0458   CALCIUM 7.9 (L) 07/19/2019 0458   GFRNONAA >60 07/19/2019 0458   GFRAA >60 07/19/2019 0458    Imaging I have reviewed images in epic and the results pertinent to this consultation are: Right temporal AVM  Assessment: 74 year old with a right temporal AVM resection presenting with left arm involuntary movements and left facial twitching.  Left arm movements are likely involuntary movements related to either seizures or the left temporal AVM but the facial twitching does look very rhythmic and could represent a seizure especially given the EEG abnormalities. Theoretically this is a focal status epilepticus.  Impression: Focal status epilepticus, seizures, right temporal AVM  Recommendations: -Ativan 1 mg IV -  Will try to be careful with benzodiazepines due to low BP and for concern of not depressing mental status too much -Keppra load 1500 mg IV x1 followed by 500 mg twice daily. -Maintain seizure precautions -Continue LTM EEG -Neurology will follow with you -Discussed the plan with attending neurosurgeon Dr. Kathyrn Sheriff  -- Amie Portland, MD [facility] Pager: [phone] If 7pm to 7am, please call on call as listed on AMION.  CRITICAL CARE ATTESTATION Performed by: Amie Portland, MD Total critical care time: 35 minutes Critical care time was exclusive of separately billable procedures and treating other  patients and/or supervising APPs/Residents/Students Critical care was necessary to treat or prevent imminent or life-threatening deterioration due to focal stats epilepticus  This patient is critically ill and at significant risk for neurological worsening and/or death and care requires constant monitoring. Critical care was time spent personally by me on the following activities: development of treatment plan with patient and/or surrogate as well as nursing, discussions with consultants, evaluation of patient's response to treatment, examination of patient, obtaining history from patient or surrogate, ordering and performing treatments and interventions, ordering and review of laboratory studies, ordering and review of radiographic studies, pulse oximetry, re-evaluation of patient's condition, participation in multidisciplinary rounds and medical decision making of high complexity in the care of this patient.

## 2019-07-20 NOTE — Evaluation (Addendum)
Occupational Therapy Evaluation Patient Details Name: Sara Adams MRN: [id number] DOB: Oct 01, 1945 Today's Date: 07/20/2019    History of Present Illness 74 y.o. female who was found to have a large right temporal arterial venous malformation during workup for left facial droop and intermittent episodes of left arm weakness. Pt underwent R frontotemporal craniotomy with resection of AVM on 3/23. 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   Clinical Impression   PTA, pt was living alone and was performing ADLs, IADLs, and short distance driving; pt planning to dc to son's home for increased support. Pt currently requiring Min Guard-Min A for ADLs and functional mobility. Pt with several episodes of LOB and requiring Min A for correction. Pt highly motivated to participate in therapy and return to PLOF. Pt presenting with tremors at LUE and left side of face, poor balance, and decreased awareness of deficits. Discussing dc recommendation with pt and son as well as recommendation for use of RW. Pt would benefit from further acute OT to facilitate safe dc. Recommend dc to home with HHOT for further OT to optimize safety, independence with ADLs, and return to PLOF.      Follow Up Recommendations  Home health OT;Supervision/Assistance - 24 hour    Equipment Recommendations  3 in 1 bedside commode;Other (comment)(RW)    Recommendations for Other Services PT consult     Precautions / Restrictions Precautions Precautions: Fall;Other (comment) Precaution Comments: continuous EEG Restrictions Weight Bearing Restrictions: No      Mobility Bed Mobility Overal bed mobility: Needs Assistance Bed Mobility: Supine to Sit     Supine to sit: Supervision     General bed mobility comments: Supervision for safety  Transfers Overall transfer level: Needs assistance Equipment used: None Transfers: Sit to/from Stand Sit to Stand: Min guard;Min assist          General transfer comment: Min guard A for safety from [facility]. Min A to steady in standing. Pt presenting with decreased control during descent to recliner. Having pt practice sit<>stand with controlled descent.     Balance Overall balance assessment: Needs assistance Sitting-balance support: No upper extremity supported;Feet supported Sitting balance-Leahy Scale: Good Sitting balance - Comments: supervision   Standing balance support: No upper extremity supported;During functional activity Standing balance-Leahy Scale: Fair Standing balance comment: minG without UE support, washing hands                           ADL either performed or assessed with clinical judgement   ADL Overall ADL's : Needs assistance/impaired Eating/Feeding: Set up;Sitting   Grooming: Wash/dry hands;Min guard;Standing Grooming Details (indicate cue type and reason): Min Guard A for safety while at sink. Upper Body Bathing: Min guard;Sitting   Lower Body Bathing: Minimal assistance;Sit to/from stand   Upper Body Dressing : Min guard;Sitting   Lower Body Dressing: Minimal assistance;Sit to/from stand Lower Body Dressing Details (indicate cue type and reason): Pt able to adjsut socks using figure four method. Educating pt and son on useing figure four method for donning LB clothing to prevent bending forward.  Toilet Transfer: Minimal assistance;Ambulation;BSC(BSC over toilet) Toilet Transfer Details (indicate cue type and reason): Pt requiring Min Guard A for safety to sit at [facility]. Min A to steady in standing. Toileting- Clothing Manipulation and Hygiene: Minimal assistance;Sit to/from stand Toileting - Clothing Manipulation Details (indicate cue type and reason): Min A for managing gown     Functional mobility during ADLs:  Minimal assistance General ADL Comments: Pt presenting with decreased balance, strength, and cognition imapcting safe performance of ADLs.      Vision Baseline Vision/History:  No visual deficits Patient Visual Report: Other (comment)(ptosis of right eye)       Perception     Praxis      Pertinent Vitals/Pain Pain Assessment: No/denies pain     Hand Dominance Right   Extremity/Trunk Assessment Upper Extremity Assessment Upper Extremity Assessment: RUE deficits/detail;LUE deficits/detail RUE Deficits / Details: Increased edema at right forearm and hand.  RUE Coordination: decreased fine motor LUE Deficits / Details: Tremor and chorea like movements. Pt able to move purposefully to wipe her mouth or grasp therapist's hand.  LUE Coordination: decreased fine motor;decreased gross motor   Lower Extremity Assessment Lower Extremity Assessment: Defer to PT evaluation   Cervical / Trunk Assessment Cervical / Trunk Assessment: Kyphotic   Communication Communication Communication: No difficulties   Cognition Arousal/Alertness: Awake/alert Behavior During Therapy: WFL for tasks assessed/performed Overall Cognitive Status: Impaired/Different from baseline Area of Impairment: Memory;Following commands;Safety/judgement;Problem solving;Awareness                     Memory: Decreased recall of precautions;Decreased short-term memory Following Commands: Follows one step commands consistently;Follows multi-step commands with increased time Safety/Judgement: Decreased awareness of safety;Decreased awareness of deficits Awareness: Emergent Problem Solving: Slow processing;Difficulty sequencing General Comments: Pt requiring increased time and cues throughout session. Following simple commands. Decreased awareness of deficits and impact on functional performance. Pt stating "yes" to feeling she has poor balance and then saying "no" at possibility to fall.    General Comments  Son present during session. pt hypotensive but asymptomatic. BP of 67/48 in semi-fowlers, 70/53 sitting, 57/41 standing and 50/34 sitting in recliner. Pt with resting tremors in facial  muscles and some intention tremor in UEs noted during activity.     Exercises     Shoulder Instructions      Home Living Family/patient expects to be discharged to:: Private residence Living Arrangements: Alone(going to live with son (greg)) Available Help at Discharge: Family;Available 24 hours/day Type of Home: House Home Access: Stairs to enter [employer] of Steps: 3 Entrance Stairs-Rails: Can reach both Home Layout: Multi-level Alternate Level Stairs-Number of Steps: flight Alternate Level Stairs-Rails: Left     Bathroom Toilet: Handicapped height     Home Equipment: None          Prior Functioning/Environment Level of Independence: Independent        Comments: ADLs, IADLs, and driving short distances        OT Problem List: Decreased strength;Decreased range of motion;Decreased activity tolerance;Impaired balance (sitting and/or standing);Decreased knowledge of use of DME or AE;Decreased knowledge of precautions;Decreased coordination;Decreased cognition;Decreased safety awareness      OT Treatment/Interventions: Self-care/ADL training;Therapeutic exercise;Energy conservation;DME and/or AE instruction;Therapeutic activities;Patient/family education    OT Goals(Current goals can be found in the care plan section) Acute Rehab OT Goals Patient Stated Goal: To go home today OT Goal Formulation: With patient/family Time For Goal Achievement: 08/03/19 Potential to Achieve Goals: Good  OT Frequency: Min 3X/week   Barriers to D/C:            Co-evaluation PT/OT/SLP Co-Evaluation/Treatment: Yes Reason for Co-Treatment: For patient/therapist safety;To address functional/ADL transfers   OT goals addressed during session: ADL's and self-care      AM-PAC OT "6 Clicks" Daily Activity     Outcome Measure Help from another person eating meals?: None Help from another person  taking care of personal grooming?: A Little Help from another person  toileting, which includes using toliet, bedpan, or urinal?: A Little Help from another person bathing (including washing, rinsing, drying)?: A Little Help from another person to put on and taking off regular upper body clothing?: A Little Help from another person to put on and taking off regular lower body clothing?: A Little 6 Click Score: 19   End of Session Nurse Communication: Mobility status  Activity Tolerance: Patient tolerated treatment well Patient left: in chair;with call bell/phone within reach;with family/visitor present  OT Visit Diagnosis: Unsteadiness on feet (R26.81);Other abnormalities of gait and mobility (R26.89);Muscle weakness (generalized) (M62.81)                Time: 2505-3976 OT Time Calculation (min): 26 min Charges:  OT General Charges $OT Visit: 1 Visit OT Evaluation $OT Eval Moderate Complexity: [place], OTR/L Acute Rehab Pager: [phone] Office: [place] 07/20/2019, 2:48 PM

## 2019-07-20 NOTE — Progress Notes (Signed)
Does not think situation is that serious per pts words

## 2019-07-20 NOTE — Progress Notes (Addendum)
  NEUROSURGERY PROGRESS NOTE   No issues overnight.  Neurology consulted due to seizure like activity.  Patient has no concerns, she is eager for discharge  EXAM:  BP (!) 88/54   Pulse 83   Temp 99.1 F (37.3 C) (Oral)   Resp (!) 27   Ht 5\' 6"  (1.676 m)   Wt 65.8 kg   SpO2 96%   BMI 23.40 kg/m   Awake, alert, oriented  Speech fluent, appropriate  CN grossly intactm right CN3 palsy Left facial twitching, LUE involuntary movements MAEW, stable motor Bandage in place  IMPRESSION/PLAN 74 y.o. female  POD2 crani for resection of AVM. Angio confirms total resection of AVM. She is at baseline. - left facial twitching/involuntary movement LUE: EEG confirmed 1 seizure. Neurology consulted. Keppra loading dose 1500mg  given today, continue 500mg  BID. Continue LT EEG. AEDs per neurology. - hypotension: asx. Continue to monitor.

## 2019-07-20 NOTE — Evaluation (Signed)
Physical Therapy Evaluation Patient Details Name: Sara Adams MRN: [id number] DOB: 03/02/1946 Today's Date: 07/20/2019   History of Present Illness  74 y.o. female who was found to have a large right temporal arterial venous malformation during workup for left facial droop and intermittent episodes of left arm weakness. Pt underwent R frontotemporal craniotomy with resection of AVM on 3/23. 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  Clinical Impression  Pt presents to PT with deficits in functional mobility, balance, endurance, gait, motor control, coordination, vision. Pt with impaired balance, with multiple LOB requiring minA from PT/OT to correct during session. Pt also with reduced safety awareness, reporting she is unsteady but is not at risk of falling, and that she will be back to normal once she gets to her house. Pt with hypotension, however no longer being medically treated for this as she is asymptomatic. PT currently recommending discharge home with HHPT, use of RW for all OOB mobility, and assistance from family for all OOB activity.    Follow Up Recommendations Home health PT;Supervision/Assistance - 24 hour    Equipment Recommendations  Rolling walker with 5" wheels;3in1 (PT)    Recommendations for Other Services       Precautions / Restrictions Precautions Precautions: Fall;Other (comment) Precaution Comments: continuous EEG Restrictions Weight Bearing Restrictions: No      Mobility  Bed Mobility Overal bed mobility: Needs Assistance Bed Mobility: Supine to Sit     Supine to sit: Supervision        Transfers Overall transfer level: Needs assistance Equipment used: None Transfers: Sit to/from Stand Sit to Stand: Min guard;Min assist         General transfer comment: minA for stand to sit in recliner  Ambulation/Gait Ambulation/Gait assistance: Min assist Gait Distance (Feet): 15 Feet(15' x 2) Assistive device:  None Gait Pattern/deviations: Step-to pattern;Staggering left;Staggering right;Drifts right/left Gait velocity: reduced Gait velocity interpretation: <1.8 ft/sec, indicate of risk for recurrent falls General Gait Details: pt with short step to gait, increased lateral sway with staggering to left and right intermittently. Pt with one posterior LOB when backing up to recliner requiring physical assistance to correct  Stairs            Wheelchair Mobility    Modified Rankin (Stroke Patients Only) Modified Rankin (Stroke Patients Only) Pre-Morbid Rankin Score: No symptoms Modified Rankin: Moderately severe disability     Balance Overall balance assessment: Needs assistance Sitting-balance support: No upper extremity supported;Feet supported Sitting balance-Leahy Scale: Good Sitting balance - Comments: supervision   Standing balance support: No upper extremity supported;During functional activity Standing balance-Leahy Scale: Fair Standing balance comment: minG without UE support, washing hands                             Pertinent Vitals/Pain Pain Assessment: No/denies pain    Home Living Family/patient expects to be discharged to:: Private residence Living Arrangements: Alone(going to live with son (greg)) Available Help at Discharge: Family;Available 24 hours/day Type of Home: House Home Access: Stairs to enter Entrance Stairs-Rails: Can reach both Entrance Stairs-Number of Steps: 3 Home Layout: Multi-level Home Equipment: None      Prior Function Level of Independence: Independent         Comments: pt only driving short distances     Hand Dominance   Dominant Hand: Right    Extremity/Trunk Assessment   Upper Extremity Assessment Upper Extremity Assessment: Defer to OT evaluation  Lower Extremity Assessment Lower Extremity Assessment: Generalized weakness    Cervical / Trunk Assessment Cervical / Trunk Assessment: Kyphotic   Communication   Communication: No difficulties  Cognition Arousal/Alertness: Awake/alert Behavior During Therapy: WFL for tasks assessed/performed Overall Cognitive Status: Impaired/Different from baseline Area of Impairment: Memory;Following commands;Safety/judgement;Problem solving                     Memory: Decreased recall of precautions;Decreased short-term memory Following Commands: Follows one step commands consistently;Follows multi-step commands with increased time Safety/Judgement: Decreased awareness of safety;Decreased awareness of deficits   Problem Solving: Slow processing;Difficulty sequencing        General Comments General comments (skin integrity, edema, etc.): pt hypotensive but asymptomatic. BP of 67/48 in semi-fowlers, 70/53 sitting, 57/41 standing and 50/34 sitting in recliner. Pt with resting tremors in facial muscles and some intention tremor in UEs noted during activity. L eye ptosis    Exercises     Assessment/Plan    PT Assessment Patient needs continued PT services  PT Problem List Decreased strength;Decreased activity tolerance;Decreased balance;Decreased mobility;Decreased coordination;Decreased cognition;Decreased knowledge of use of DME;Decreased safety awareness;Decreased knowledge of precautions       PT Treatment Interventions Gait training;DME instruction;Stair training;Functional mobility training;Therapeutic activities;Therapeutic exercise;Balance training;Neuromuscular re-education;Patient/family education    PT Goals (Current goals can be found in the Care Plan section)  Acute Rehab PT Goals Patient Stated Goal: To go home today PT Goal Formulation: With patient/family Time For Goal Achievement: 08/03/19 Potential to Achieve Goals: Good    Frequency Min 4X/week   Barriers to discharge        Co-evaluation PT/OT/SLP Co-Evaluation/Treatment: Yes Reason for Co-Treatment: Complexity of the patient's impairments (multi-system  involvement);Necessary to address cognition/behavior during functional activity;For patient/therapist safety;To address functional/ADL transfers PT goals addressed during session: Mobility/safety with mobility;Balance;Strengthening/ROM         AM-PAC PT "6 Clicks" Mobility  Outcome Measure Help needed turning from your back to your side while in a flat bed without using bedrails?: A Little Help needed moving from lying on your back to sitting on the side of a flat bed without using bedrails?: A Little Help needed moving to and from a bed to a chair (including a wheelchair)?: A Little Help needed standing up from a chair using your arms (e.g., wheelchair or bedside chair)?: A Little Help needed to walk in hospital room?: A Little Help needed climbing 3-5 steps with a railing? : A Lot 6 Click Score: 17    End of Session   Activity Tolerance: Patient tolerated treatment well Patient left: in chair;with call bell/phone within reach;with family/visitor present Nurse Communication: Mobility status PT Visit Diagnosis: Unsteadiness on feet (R26.81);Other symptoms and signs involving the nervous system (R29.898)    Time: 3300-7622 PT Time Calculation (min) (ACUTE ONLY): 24 min   Charges:   PT Evaluation $PT Eval Moderate Complexity: 1 Mod          Zenaida Niece, PT, DPT Acute Rehabilitation Pager: [phone]   Zenaida Niece 07/20/2019, 10:17 AM

## 2019-07-20 NOTE — Procedures (Addendum)
Patient Name: Sara Adams  MRN: [id number]  Epilepsy Attending: Charlsie Quest  Referring Physician/Provider: Dr Ritta Slot Duration: 07/19/2019 2001 to 07/20/2019 2001  Patient history: 74yo F s/p R FT crani resection of AVM. Noted to have left facial and arm twitching. EEG to evaluate for seizure.   Level of alertness: awake, asleep  AEDs during EEG study:  LEV  Technical aspects: This EEG study was done with scalp electrodes positioned according to the 10-20 International system of electrode placement. Electrical activity was acquired at a sampling rate of 500Hz  and reviewed with a high frequency filter of 70Hz  and a low frequency filter of 1Hz . EEG data were recorded continuously and digitally stored.   DESCRIPTION: No clear posterior dominant rhythm was seen. EEG showed continuous generalized, maximal right temporal region, 3-6hz  theta-delta slowing. Frequent sharp waves were seen in right posterior temporal region.   One seizure without clinical change was noted at 0024 on 07/20/2019 lasting about . Concomitant EEG showed 5 Hz theta activity in the right posterior temporal region which gradually evolved into 6 to 7 Hz theta activity followed by 2 to 3 Hz delta activity.   ABNORMALITY - Seizure without clinical signs, right posterior temporal region - Sharp waves, right posterior temporal region - Continuous slow, generalized and maximal right temporal region  IMPRESSION: This study showed 1 seizure without clinical signs at 0024 on 07/20/2019, lasting about 1 minute arising from right posterior temporal region.  There is also evidence of cortical dysfunction in right temporal region consistent with craniotomy as well as moderate diffuse encephalopathy, non specific to etiology.   Nateisha Moyd 07/01/2019

## 2019-07-21 DIAGNOSIS — G40109 Localization-related (focal) (partial) symptomatic epilepsy and epileptic syndromes with simple partial seizures, not intractable, without status epilepticus: Secondary | ICD-10-CM

## 2019-07-21 LAB — CBC
HCT: 25.7 % — ABNORMAL LOW (ref 36.0–46.0)
Hemoglobin: 8.9 g/dL — ABNORMAL LOW (ref 12.0–15.0)
MCH: 31.8 pg (ref 26.0–34.0)
MCHC: 34.6 g/dL (ref 30.0–36.0)
MCV: 91.8 fL (ref 80.0–100.0)
Platelets: 205 10*3/uL (ref 150–400)
RBC: 2.8 MIL/uL — ABNORMAL LOW (ref 3.87–5.11)
RDW: 11.7 % (ref 11.5–15.5)
WBC: 8.2 10*3/uL (ref 4.0–10.5)
nRBC: 0 % (ref 0.0–0.2)

## 2019-07-21 LAB — URINALYSIS, ROUTINE W REFLEX MICROSCOPIC
Bilirubin Urine: NEGATIVE
Glucose, UA: NEGATIVE mg/dL
Ketones, ur: 20 mg/dL — AB
Nitrite: NEGATIVE
Protein, ur: NEGATIVE mg/dL
Specific Gravity, Urine: 1.012 (ref 1.005–1.030)
pH: 8 (ref 5.0–8.0)

## 2019-07-21 LAB — BASIC METABOLIC PANEL
Anion gap: 12 (ref 5–15)
BUN: <5 mg/dL — ABNORMAL LOW (ref 8–23)
CO2: 28 mmol/L (ref 22–32)
Calcium: 8.4 mg/dL — ABNORMAL LOW (ref 8.9–10.3)
Chloride: 98 mmol/L (ref 98–111)
Creatinine, Ser: 0.48 mg/dL (ref 0.44–1.00)
GFR calc Af Amer: >60 mL/min (ref >60)
GFR calc non Af Amer: >60 mL/min (ref >60)
Glucose, Bld: 113 mg/dL — ABNORMAL HIGH (ref 70–99)
Potassium: 2.3 mmol/L — CL (ref 3.5–5.1)
Sodium: 138 mmol/L (ref 135–145)

## 2019-07-21 MED ORDER — LEVETIRACETAM 500 MG PO TABS: 500.0000 mg | ORAL_TABLET | Freq: Two times a day (BID) | ORAL | Status: DC

## 2019-07-21 MED ORDER — LEVETIRACETAM 500 MG PO TABS
500.0000 mg | ORAL_TABLET | Freq: Two times a day (BID) | ORAL | Status: DC
  Administered 2019-07-21: 500 mg via ORAL
  Filled 2019-07-21: qty 1

## 2019-07-21 MED ORDER — POTASSIUM CHLORIDE CRYS ER 20 MEQ PO TBCR
40.0000 meq | EXTENDED_RELEASE_TABLET | ORAL | Status: DC
  Administered 2019-07-21 (×2): 40 meq via ORAL
  Filled 2019-07-21 (×3): qty 2

## 2019-07-21 NOTE — Progress Notes (Addendum)
Pt suddenly thinks she is at home and slightly more confused and lethargic, son states "she seems more confused and is not the same as yesterday".  Cindra Presume PA notified and orders for urinalysis and labs in.  Tx orders discontinued.

## 2019-07-21 NOTE — Progress Notes (Addendum)
Subjective: No other acute events overnight.  Requesting to go home.  States she has had left arm movements for a couple of months.  When inquired about right eye ptosis, states she is not sure how long its been there and it does not bother her.   ROS: negative except above  Examination  Vital signs in last 24 hours: Temp:  [97.6 F (36.4 C)-99.3 F (37.4 C)] 99 F (37.2 C) (03/26 0800) Pulse Rate:  [63-103] 76 (03/26 0800) Resp:  [20-32] 22 (03/26 0800) BP: (50-102)/(34-68) 95/58 (03/26 0800) SpO2:  [92 %-98 %] 96 % (03/26 0800)  General: lying in bed, not in apparent distress CVS: pulse-normal rate and rhythm RS: breathing comfortably, CTA B Extremities: normal, warm  Neuro: MS: Alert, oriented, follows commands CN: pupils equal and reactive, EOMI, right ptosis, face symmetric, tongue midline, normal sensation over face Motor: Moving all 4 extremities with antigravity strength voluntarily in bed.  Continuous nonrhythmic choreiform movements of left upper extremity which can be interrupted when patient moves her arm but reappear soon. Gait: not tested  Basic Metabolic Panel: Recent Labs  Lab 11-Aug-2019 0648 2019/08/11 1024 08/11/2019 1253 07/19/19 0458  NA 140 139 139 141  K 3.8 3.6 3.9 3.3*  CL 106  --   --  109  CO2 26  --   --  23  GLUCOSE 102*  --   --  125*  BUN 12  --   --  5*  CREATININE 0.37*  --   --  0.43*  CALCIUM 9.0  --   --  7.9*    CBC: Recent Labs  Lab 11-Aug-2019 0648 2019/08/11 1024 August 11, 2019 1253 07/19/19 0458  WBC 5.6  --   --  13.9*  NEUTROABS 3.0  --   --   --   HGB 13.0 11.9* 10.2* 9.1*  HCT 40.3 35.0* 30.0* 27.4*  MCV 95.5  --   --  95.1  PLT 212  --   --  216     Coagulation Studies: No results for input(s): LABPROT, INR in the last 72 hours.  Imaging MR brain wo contrast: Unchanged right temporal arteriovenous malformation.  No acute intracranial abnormality.     ASSESSMENT AND PLAN: 74 year old female who presented with left facial  droop and transient arm weakness and was found to have right temporal AVM status post right craniotomy for resection.  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  Recommendations -We will discontinue LTM as patient has not had any further seizures -Continue Keppra 500 mg twice daily.  Will transition to p.o. today. -Patient states the choreiform movements are not bothering her at this point.  Therefore, would recommend follow-up with neurology (preferably movement disorder specialist) as an outpatient in 6 to 8 weeks after discharge for management  -Seizure precautions including do not drive  Thank you for allowing [place] to participate in the care of this patient.  Neurology will sign off.  Please page neuro hospitalist for any further questions after 5 PM.  I have spent a total of  25  minutes with the patient reviewing hospital notes,  test results, labs and examining the patient as well as establishing an assessment and plan that was discussed personally with the patient.  >  50% of time was spent in direct patient care.  Kongmeng Santoro Barbra Sarks

## 2019-07-21 NOTE — TOC Initial Note (Addendum)
Transition of Care [facility]) - Initial/Assessment Note    Patient Details  Name: Sara Adams MRN: [id number] Date of Birth: Aug 28, 1945  Transition of Care [facility]) CM/SW Contact:    Kingsley Plan, RN Phone Number: 07/21/2019, 8:44 AM  Clinical Narrative:                 Spoke to patient's son Sara Adams via phone. Patient will discharge to Greg's address, family able to provide 24 hour assistance. Home health arranged with [name] with [facility].  [address], [place] [address] Requested Home health orders and face to face from PA.   Called Zack with [employer] [facility] for walker and 3 in 1   Expected Discharge Plan: Home w Home Health Services Barriers to Discharge: Continued Medical Work up   Patient Goals and CMS Choice     Choice offered to / list presented to : Adult Children  Expected Discharge Plan and Services Expected Discharge Plan: Home w Home Health Services   Discharge Planning Services: CM Consult Post Acute Care Choice: Durable Medical Equipment, Home Health                   DME Arranged: 3-N-1, Walker rolling DME Agency: [facility] Date DME Agency Contacted: 07/21/19 Time DME Agency Contacted: [phone] Representative spoke with at DME Agency: Zack HH Arranged: PT HH Agency: [facility] Date [name] Agency Contacted: 07/21/19 Time HH Agency Contacted: [phone] Representative spoke with at [facility] Agency: [name]  Prior Living Arrangements/Services   Lives with:: Self Patient language and need for interpreter reviewed:: Yes Do you feel safe going back to the place where you live?: Yes      Need for Family Participation in Patient Care: Yes (Comment) Care giver support system in place?: Yes (comment)   Criminal Activity/Legal Involvement Pertinent to Current Situation/Hospitalization: No - Comment as needed  Activities of Daily Living      Permission Sought/Granted   Permission granted to share information with : Yes, Verbal Permission Granted  Share  Information with NAME: Sara Adams           Emotional Assessment Appearance:: Appears stated age            Admission diagnosis:  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 Cerebral AVM 2019-07-21  . AVM (arteriovenous malformation) 07-21-19   PCP:  Dema Severin, NP Pharmacy:   [facility], [place] - [address] [place] [address] Phone: [phone] Fax: [phone]     Social Determinants of Health (SDOH) Interventions    Readmission Risk Interventions No flowsheet data found.

## 2019-07-21 NOTE — Progress Notes (Addendum)
CRITICAL VALUE ALERT  Critical Value:  K 2.3  Date & Time Notied:  3/26 1702  Provider Notified: Hildred Priest NP  Orders Received/Actions taken:  Awaiting orders

## 2019-07-21 NOTE — Progress Notes (Signed)
Rehab Admissions Coordinator Note:  Per PT recommendation, this patient was screened by Cheri Rous for appropriateness for an Inpatient Acute Rehab Consult.  At this time, we are recommending an Inpatient Rehab consult. AC will contact MD to request order.   Cheri Rous 07/21/2019, 5:08 PM  I can be reached at [phone].

## 2019-07-21 NOTE — Progress Notes (Signed)
vLTM EEG complete. No skin breakdown 

## 2019-07-21 NOTE — Progress Notes (Addendum)
Patient now alert and oriented to only self and event. On the 25th she was alert and oriented x4 and was able to appropriately answer questions.   During day shift today, she was only A/Ox3 missing location and now is only A/Ox2.   Also unable to completely swallow her 10pm meds safely.   No new orders currently.

## 2019-07-21 NOTE — Procedures (Addendum)
Patient Name:Sara Adams HUT:[id number] Epilepsy Attending:Otniel Hoe Annabelle Harman Referring Physician/Provider:Dr Ritta Slot Duration:07/20/2019 2001 to 07/21/2019 0901 Total duration of study: 37 hours  Patient history:73yo F s/p R FT crani resection of AVM. Noted to have left facial and arm twitching. EEG to evaluate for seizure.   Level of alertness:awake, asleep  AEDs during EEG study: LEV  Technical aspects: This EEG study was done with scalp electrodes positioned according to the 10-20 International system of electrode placement. Electrical activity was acquired at a sampling rate of 500Hz  and reviewed with a high frequency filter of 70Hz  and a low frequency filter of 1Hz . EEG data were recorded continuously and digitally stored.  DESCRIPTION: No clear posterior dominant rhythm was seen. EEG showed continuous generalized, maximal right temporal region, 3-6hz  theta-delta slowing. Frequent sharp waves were seen in right posterior temporal region.   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  There is also evidence of cortical dysfunction in right temporal region consistent with craniotomy as well as moderate diffuse encephalopathy, non specific to etiology. No definite seizures were seen during this study.   Fredy Gladu 

## 2019-07-21 NOTE — Progress Notes (Signed)
  NEUROSURGERY PROGRESS NOTE   No issues overnight. Pt remains eager for discharge. No new complaints.  EXAM:  BP (!) 95/58 (BP Location: Right Arm)   Pulse 76   Temp 99 F (37.2 C) (Oral)   Resp (!) 22   Ht 5\' 6"  (1.676 m)   Wt 65.8 kg   SpO2 96%   BMI 23.40 kg/m   Awake, alert Speech fluent, appropriate  CN grossly intact x partial right occulomotor palsy 5/5 BUE/BLE   IMPRESSION: - 74 y.o. female s/p resection of right temporal AVM  - partial motor SZ. Currently on Keppra. - Hypotension remains asymptomatic  PLAN: - Overnight EEG interpretation pending. If SZ controlled can plan on d/c. - Cont Keppra

## 2019-07-21 NOTE — Progress Notes (Signed)
Physical Therapy Treatment Patient Details Name: Sara Adams MRN: [id number] DOB: [birth year] Today's Date: 07/21/2019    History of Present Illness 74 y.o. female who was found to have a large right temporal arterial venous malformation during workup for left facial droop and intermittent episodes of left arm weakness. Pt underwent R frontotemporal craniotomy with resection of AVM on 3/23. 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    PT Comments    Pt tolerated treatment well, although demonstrating some regression this session. Pt more groggy at this time and with ataxic movement of LUE, unable to [employer] effectively. Pt with inattention to R side during mobility, bumping into multiple objext and requiring PT assist to prevent multiple potential falls. PT notes increased coughing after taking sips of ensure and recommends Speech therapy consult. PT also recommending CIR at time of discharge due to increase unsteadiness during session, son in agreement.   Follow Up Recommendations  CIR;Supervision/Assistance - 24 hour     Equipment Recommendations  (defer to post-acute setting)    Recommendations for Other Services Rehab consult;Speech consult     Precautions / Restrictions Precautions Precautions: Fall Precaution Comments: craniotomy Restrictions Weight Bearing Restrictions: No    Mobility  Bed Mobility                  Transfers Overall transfer level: Needs assistance Equipment used: None Transfers: Sit to/from Stand Sit to Stand: Min assist            Ambulation/Gait Ambulation/Gait assistance: Min assist;Mod assist Gait Distance (Feet): 150 Feet(140' without RW, 10' with RW) Assistive device: Rolling walker (2 wheeled);1 person hand held assist Gait Pattern/deviations: Step-to pattern;Staggering right;Staggering left;Drifts right/left Gait velocity: reduced Gait velocity interpretation: <1.8 ft/sec, indicate of risk  for recurrent falls General Gait Details: with RW pt unable to maintain grip with L hand significantly impaired LUE coordination. Pt with multiple LOB with use of RW requiring modA to correct. Without RW pt is more steady however continues to demonstrate multiple LOB, often drifting to right side and bumping into objects on right   Stairs             Wheelchair Mobility    Modified Rankin (Stroke Patients Only) Modified Rankin (Stroke Patients Only) Pre-Morbid Rankin Score: No symptoms Modified Rankin: Moderately severe disability     Balance Overall balance assessment: Needs assistance Sitting-balance support: No upper extremity supported;Feet supported Sitting balance-Leahy Scale: Good Sitting balance - Comments: close supervision in recliner   Standing balance support: No upper extremity supported Standing balance-Leahy Scale: Poor Standing balance comment: minA for static standing balance                            Cognition Arousal/Alertness: Awake/alert Behavior During Therapy: Impulsive Overall Cognitive Status: Impaired/Different from baseline Area of Impairment: Memory;Following commands;Safety/judgement;Awareness;Problem solving                     Memory: Decreased recall of precautions;Decreased short-term memory Following Commands: Follows one step commands consistently;Follows multi-step commands with increased time Safety/Judgement: Decreased awareness of safety;Decreased awareness of deficits   Problem Solving: Slow processing;Difficulty sequencing;Requires verbal cues;Requires tactile cues        Exercises      General Comments General comments (skin integrity, edema, etc.): pt with ataxic movements of LUE, nonpurposeful movements of LUE at rest and during mobility. Pt appears more gorggy and more confused than  yesterday. Significant safety awareness deficits and lack of awareness of deficits at this time. Pt on 1L [place] througohut  session with VSS.      Pertinent Vitals/Pain Pain Assessment: 0-10 Pain Score: 10-Worst pain ever Pain Location: head Pain Descriptors / Indicators: Aching Pain Intervention(s): Monitored during session    Home Living                      Prior Function            PT Goals (current goals can now be found in the care plan section) Acute Rehab PT Goals Patient Stated Goal: To go home today Progress towards PT goals: Not progressing toward goals - comment(pt requiring increased assistance this session)    Frequency    Min 4X/week      PT Plan Discharge plan needs to be updated    Co-evaluation              AM-PAC PT "6 Clicks" Mobility   Outcome Measure  Help needed turning from your back to your side while in a flat bed without using bedrails?: A Little Help needed moving from lying on your back to sitting on the side of a flat bed without using bedrails?: A Little Help needed moving to and from a bed to a chair (including a wheelchair)?: A Lot Help needed standing up from a chair using your arms (e.g., wheelchair or bedside chair)?: A Little Help needed to walk in hospital room?: A Lot Help needed climbing 3-5 steps with a railing? : Total 6 Click Score: 14    End of Session Equipment Utilized During Treatment: Oxygen;Gait belt Activity Tolerance: Patient tolerated treatment well Patient left: in chair;with call bell/phone within reach;with nursing/sitter in room;with family/visitor present;with chair alarm set Nurse Communication: Mobility status PT Visit Diagnosis: Unsteadiness on feet (R26.81);Other symptoms and signs involving the nervous system (R29.898)     Time: 8469-6295 PT Time Calculation (min) (ACUTE ONLY): 25 min  Charges:  $Gait Training: 8-22 mins $Therapeutic Activity: 8-22 mins                     Arlyss Gandy, PT, DPT Acute Rehabilitation Pager: [phone]    Arlyss Gandy 07/21/2019, 11:39 AM

## 2019-07-22 ENCOUNTER — Inpatient Hospital Stay (HOSPITAL_COMMUNITY): Payer: Medicare HMO

## 2019-07-22 ENCOUNTER — Inpatient Hospital Stay (HOSPITAL_COMMUNITY): Payer: Medicare HMO | Admitting: Anesthesiology

## 2019-07-22 ENCOUNTER — Encounter (HOSPITAL_COMMUNITY): Admission: RE | Disposition: E | Payer: Self-pay | Source: Ambulatory Visit | Attending: Neurosurgery

## 2019-07-22 DIAGNOSIS — Q282 Arteriovenous malformation of cerebral vessels: Principal | ICD-10-CM

## 2019-07-22 DIAGNOSIS — R579 Shock, unspecified: Secondary | ICD-10-CM

## 2019-07-22 DIAGNOSIS — I629 Nontraumatic intracranial hemorrhage, unspecified: Secondary | ICD-10-CM

## 2019-07-22 DIAGNOSIS — J9601 Acute respiratory failure with hypoxia: Secondary | ICD-10-CM

## 2019-07-22 DIAGNOSIS — Q2730 Arteriovenous malformation, site unspecified: Secondary | ICD-10-CM

## 2019-07-22 HISTORY — PX: CRANIOTOMY: SHX93

## 2019-07-22 LAB — POCT I-STAT, CHEM 8
BUN: 5 mg/dL — ABNORMAL LOW (ref 8–23)
BUN: 6 mg/dL — ABNORMAL LOW (ref 8–23)
Calcium, Ion: 1.2 mmol/L (ref 1.15–1.40)
Calcium, Ion: 1.13 mmol/L — ABNORMAL LOW (ref 1.15–1.40)
Chloride: 101 mmol/L (ref 98–111)
Chloride: 101 mmol/L (ref 98–111)
Creatinine, Ser: 0.3 mg/dL — ABNORMAL LOW (ref 0.44–1.00)
Creatinine, Ser: 0.3 mg/dL — ABNORMAL LOW (ref 0.44–1.00)
Glucose, Bld: 285 mg/dL — ABNORMAL HIGH (ref 70–99)
Glucose, Bld: 261 mg/dL — ABNORMAL HIGH (ref 70–99)
HCT: 28 % — ABNORMAL LOW (ref 36.0–46.0)
HCT: 40 % (ref 36.0–46.0)
Hemoglobin: 9.5 g/dL — ABNORMAL LOW (ref 12.0–15.0)
Hemoglobin: 13.6 g/dL (ref 12.0–15.0)
Potassium: 3.5 mmol/L (ref 3.5–5.1)
Potassium: 3.8 mmol/L (ref 3.5–5.1)
Sodium: 137 mmol/L (ref 135–145)
Sodium: 138 mmol/L (ref 135–145)
TCO2: 24 mmol/L (ref 22–32)
TCO2: 25 mmol/L (ref 22–32)

## 2019-07-22 LAB — POCT I-STAT 7, (LYTES, BLD GAS, ICA,H+H)
Acid-base deficit: 7 mmol/L — ABNORMAL HIGH (ref 0.0–2.0)
Acid-base deficit: 5 mmol/L — ABNORMAL HIGH (ref 0.0–2.0)
Acid-base deficit: 7 mmol/L — ABNORMAL HIGH (ref 0.0–2.0)
Bicarbonate: 25.1 mmol/L (ref 20.0–28.0)
Bicarbonate: 21.6 mmol/L (ref 20.0–28.0)
Bicarbonate: 22.3 mmol/L (ref 20.0–28.0)
Bicarbonate: 22.8 mmol/L (ref 20.0–28.0)
Calcium, Ion: 1.11 mmol/L — ABNORMAL LOW (ref 1.15–1.40)
Calcium, Ion: 0.87 mmol/L — CL (ref 1.15–1.40)
Calcium, Ion: 0.73 mmol/L — CL (ref 1.15–1.40)
Calcium, Ion: 1.08 mmol/L — ABNORMAL LOW (ref 1.15–1.40)
HCT: 24 % — ABNORMAL LOW (ref 36.0–46.0)
HCT: 23 % — ABNORMAL LOW (ref 36.0–46.0)
HCT: 33 % — ABNORMAL LOW (ref 36.0–46.0)
HCT: 30 % — ABNORMAL LOW (ref 36.0–46.0)
Hemoglobin: 8.2 g/dL — ABNORMAL LOW (ref 12.0–15.0)
Hemoglobin: 7.8 g/dL — ABNORMAL LOW (ref 12.0–15.0)
Hemoglobin: 11.2 g/dL — ABNORMAL LOW (ref 12.0–15.0)
Hemoglobin: 10.2 g/dL — ABNORMAL LOW (ref 12.0–15.0)
O2 Saturation: 99 %
O2 Saturation: 100 %
O2 Saturation: 99 %
O2 Saturation: 82 %
Patient temperature: 36.7
Patient temperature: 36.6
Potassium: 2.6 mmol/L — CL (ref 3.5–5.1)
Potassium: 3 mmol/L — ABNORMAL LOW (ref 3.5–5.1)
Potassium: 3.3 mmol/L — ABNORMAL LOW (ref 3.5–5.1)
Potassium: 2.8 mmol/L — ABNORMAL LOW (ref 3.5–5.1)
Sodium: 134 mmol/L — ABNORMAL LOW (ref 135–145)
Sodium: 138 mmol/L (ref 135–145)
Sodium: 140 mmol/L (ref 135–145)
Sodium: 142 mmol/L (ref 135–145)
TCO2: 26 mmol/L (ref 22–32)
TCO2: 23 mmol/L (ref 22–32)
TCO2: 24 mmol/L (ref 22–32)
TCO2: 25 mmol/L (ref 22–32)
pCO2 arterial: 41.1 mmHg (ref 32.0–48.0)
pCO2 arterial: 60.4 mmHg — ABNORMAL HIGH (ref 32.0–48.0)
pCO2 arterial: 49.8 mmHg — ABNORMAL HIGH (ref 32.0–48.0)
pCO2 arterial: 66.4 mmHg (ref 32.0–48.0)
pH, Arterial: 7.394 (ref 7.350–7.450)
pH, Arterial: 7.159 — CL (ref 7.350–7.450)
pH, Arterial: 7.26 — ABNORMAL LOW (ref 7.350–7.450)
pH, Arterial: 7.141 — CL (ref 7.350–7.450)
pO2, Arterial: 164 mmHg — ABNORMAL HIGH (ref 83.0–108.0)
pO2, Arterial: 239 mmHg — ABNORMAL HIGH (ref 83.0–108.0)
pO2, Arterial: 183 mmHg — ABNORMAL HIGH (ref 83.0–108.0)
pO2, Arterial: 61 mmHg — ABNORMAL LOW (ref 83.0–108.0)

## 2019-07-22 LAB — BASIC METABOLIC PANEL
Anion gap: 14 (ref 5–15)
Anion gap: 16 — ABNORMAL HIGH (ref 5–15)
BUN: <5 mg/dL — ABNORMAL LOW (ref 8–23)
BUN: 8 mg/dL (ref 8–23)
CO2: 24 mmol/L (ref 22–32)
CO2: 19 mmol/L — ABNORMAL LOW (ref 22–32)
Calcium: 8.4 mg/dL — ABNORMAL LOW (ref 8.9–10.3)
Calcium: 10.2 mg/dL (ref 8.9–10.3)
Chloride: 97 mmol/L — ABNORMAL LOW (ref 98–111)
Chloride: 103 mmol/L (ref 98–111)
Creatinine, Ser: 0.52 mg/dL (ref 0.44–1.00)
Creatinine, Ser: 0.58 mg/dL (ref 0.44–1.00)
GFR calc Af Amer: >60 mL/min (ref >60)
GFR calc Af Amer: >60 mL/min (ref >60)
GFR calc non Af Amer: >60 mL/min (ref >60)
GFR calc non Af Amer: >60 mL/min (ref >60)
Glucose, Bld: 112 mg/dL — ABNORMAL HIGH (ref 70–99)
Glucose, Bld: 273 mg/dL — ABNORMAL HIGH (ref 70–99)
Potassium: 2.9 mmol/L — ABNORMAL LOW (ref 3.5–5.1)
Potassium: 4.9 mmol/L (ref 3.5–5.1)
Sodium: 135 mmol/L (ref 135–145)
Sodium: 138 mmol/L (ref 135–145)

## 2019-07-22 LAB — PREPARE RBC (CROSSMATCH)

## 2019-07-22 LAB — CBC
HCT: 33.1 % — ABNORMAL LOW (ref 36.0–46.0)
Hemoglobin: 10.8 g/dL — ABNORMAL LOW (ref 12.0–15.0)
MCH: 28.6 pg (ref 26.0–34.0)
MCHC: 32.6 g/dL (ref 30.0–36.0)
MCV: 87.6 fL (ref 80.0–100.0)
Platelets: 64 10*3/uL — ABNORMAL LOW (ref 150–400)
RBC: 3.78 MIL/uL — ABNORMAL LOW (ref 3.87–5.11)
RDW: 17.3 % — ABNORMAL HIGH (ref 11.5–15.5)
WBC: 11.8 10*3/uL — ABNORMAL HIGH (ref 4.0–10.5)
nRBC: 0.3 % — ABNORMAL HIGH (ref 0.0–0.2)

## 2019-07-22 LAB — MASSIVE TRANSFUSION PROTOCOL ORDER (BLOOD BANK NOTIFICATION)

## 2019-07-22 SURGERY — CRANIOTOMY HEMATOMA EVACUATION SUBDURAL
Anesthesia: General | Site: Head | Laterality: Right

## 2019-07-22 MED ORDER — PHENYLEPHRINE HCL (PRESSORS) 10 MG/ML IV SOLN
INTRAVENOUS | Status: DC | PRN
  Administered 2019-07-22 (×2): 40 ug via INTRAVENOUS

## 2019-07-22 MED ORDER — LIDOCAINE 2% (20 MG/ML) 5 ML SYRINGE
INTRAMUSCULAR | Status: AC
  Filled 2019-07-22: qty 5

## 2019-07-22 MED ORDER — NOREPINEPHRINE 4 MG/250ML-% IV SOLN
0.0000 ug/min | Freq: Once | INTRAVENOUS | Status: AC
  Administered 2019-07-22: 20 ug/min via INTRAVENOUS
  Filled 2019-07-22: qty 250

## 2019-07-22 MED ORDER — MICROFIBRILLAR COLL HEMOSTAT EX POWD
CUTANEOUS | Status: DC | PRN
  Administered 2019-07-22: 5 g via TOPICAL

## 2019-07-22 MED ORDER — THROMBIN 20000 UNITS EX SOLR
CUTANEOUS | Status: AC
  Filled 2019-07-22: qty 20000

## 2019-07-22 MED ORDER — DEXAMETHASONE SODIUM PHOSPHATE 10 MG/ML IJ SOLN
INTRAMUSCULAR | Status: DC | PRN
  Administered 2019-07-22: 10 mg via INTRAVENOUS

## 2019-07-22 MED ORDER — MIDAZOLAM HCL 5 MG/5ML IJ SOLN
INTRAMUSCULAR | Status: DC | PRN
  Administered 2019-07-22: 2 mg via INTRAVENOUS
  Administered 2019-07-22: 4 mg via INTRAVENOUS

## 2019-07-22 MED ORDER — VANCOMYCIN HCL 1000 MG IV SOLR
INTRAVENOUS | Status: DC | PRN
  Administered 2019-07-22: 1000 mg via INTRAVENOUS

## 2019-07-22 MED ORDER — MIDAZOLAM HCL 2 MG/2ML IJ SOLN
INTRAMUSCULAR | Status: AC
  Filled 2019-07-22: qty 4

## 2019-07-22 MED ORDER — VANCOMYCIN HCL IN DEXTROSE 1-5 GM/200ML-% IV SOLN
INTRAVENOUS | Status: AC
  Filled 2019-07-22: qty 200

## 2019-07-22 MED ORDER — EPINEPHRINE 1 MG/10ML IJ SOSY
PREFILLED_SYRINGE | INTRAMUSCULAR | Status: DC | PRN
  Administered 2019-07-22 (×3): 1 mg via INTRAVENOUS

## 2019-07-22 MED ORDER — FENTANYL CITRATE (PF) 100 MCG/2ML IJ SOLN: 25.0000 ug | Freq: Once | INTRAMUSCULAR | Status: DC

## 2019-07-22 MED ORDER — CHLORHEXIDINE GLUCONATE 0.12% ORAL RINSE (MEDLINE KIT): 15.0000 mL | Freq: Two times a day (BID) | OROMUCOSAL | Status: DC

## 2019-07-22 MED ORDER — HEMOSTATIC AGENTS (NO CHARGE) OPTIME
TOPICAL | Status: DC | PRN
  Administered 2019-07-22: 1 via TOPICAL

## 2019-07-22 MED ORDER — FENTANYL 2500MCG IN NS 250ML (10MCG/ML) PREMIX INFUSION
25.0000 ug/h | INTRAVENOUS | Status: DC
  Administered 2019-07-22: 50 ug/h via INTRAVENOUS

## 2019-07-22 MED ORDER — PROPOFOL 1000 MG/100ML IV EMUL
INTRAVENOUS | Status: AC
  Filled 2019-07-22: qty 100

## 2019-07-22 MED ORDER — THROMBIN 5000 UNITS EX SOLR
OROMUCOSAL | Status: DC | PRN
  Administered 2019-07-22: 5 mL via TOPICAL

## 2019-07-22 MED ORDER — PHENYLEPHRINE 40 MCG/ML (10ML) SYRINGE FOR IV PUSH (FOR BLOOD PRESSURE SUPPORT)
PREFILLED_SYRINGE | INTRAVENOUS | Status: AC
  Filled 2019-07-22: qty 10

## 2019-07-22 MED ORDER — ALBUTEROL SULFATE HFA 108 (90 BASE) MCG/ACT IN AERS
INHALATION_SPRAY | RESPIRATORY_TRACT | Status: DC | PRN
  Administered 2019-07-22: 4 via RESPIRATORY_TRACT

## 2019-07-22 MED ORDER — PANTOPRAZOLE SODIUM 40 MG IV SOLR: 40.0000 mg | Freq: Every day | INTRAVENOUS | Status: DC

## 2019-07-22 MED ORDER — EPINEPHRINE 1 MG/10ML IJ SOSY
PREFILLED_SYRINGE | INTRAMUSCULAR | Status: AC
  Filled 2019-07-22: qty 20

## 2019-07-22 MED ORDER — LEVETIRACETAM IN NACL 500 MG/100ML IV SOLN
500.0000 mg | Freq: Two times a day (BID) | INTRAVENOUS | Status: DC
  Administered 2019-07-22: 500 mg via INTRAVENOUS
  Filled 2019-07-22: qty 100

## 2019-07-22 MED ORDER — VASOPRESSIN 20 UNIT/ML IV SOLN
INTRAVENOUS | Status: DC | PRN
  Administered 2019-07-22 (×2): 2 [IU] via INTRAVENOUS
  Administered 2019-07-22 (×5): 1 [IU] via INTRAVENOUS
  Administered 2019-07-22 (×3): 2 [IU] via INTRAVENOUS
  Administered 2019-07-22: 1 [IU] via INTRAVENOUS
  Administered 2019-07-22 (×5): 2 [IU] via INTRAVENOUS
  Administered 2019-07-22 (×2): 1 [IU] via INTRAVENOUS

## 2019-07-22 MED ORDER — MIDAZOLAM HCL 2 MG/2ML IJ SOLN: 1.0000 mg | INTRAMUSCULAR | Status: DC | PRN

## 2019-07-22 MED ORDER — ALBUMIN HUMAN 5 % IV SOLN: INTRAVENOUS | Status: DC | PRN

## 2019-07-22 MED ORDER — PHENYLEPHRINE 40 MCG/ML (10ML) SYRINGE FOR IV PUSH (FOR BLOOD PRESSURE SUPPORT)
PREFILLED_SYRINGE | INTRAVENOUS | Status: DC | PRN
  Administered 2019-07-22: 80 ug via INTRAVENOUS
  Administered 2019-07-22: 120 ug via INTRAVENOUS
  Administered 2019-07-22: 80 ug via INTRAVENOUS
  Administered 2019-07-22: 120 ug via INTRAVENOUS
  Administered 2019-07-22: 80 ug via INTRAVENOUS

## 2019-07-22 MED ORDER — FENTANYL CITRATE (PF) 250 MCG/5ML IJ SOLN
INTRAMUSCULAR | Status: AC
  Filled 2019-07-22: qty 5

## 2019-07-22 MED ORDER — ACETAMINOPHEN 10 MG/ML IV SOLN
INTRAVENOUS | Status: AC
  Filled 2019-07-22: qty 100

## 2019-07-22 MED ORDER — CALCIUM CHLORIDE 10 % IV SOLN
INTRAVENOUS | Status: AC
  Filled 2019-07-22: qty 10

## 2019-07-22 MED ORDER — POTASSIUM CHLORIDE IN NACL 40-0.9 MEQ/L-% IV SOLN
INTRAVENOUS | Status: DC
  Filled 2019-07-22: qty 1000

## 2019-07-22 MED ORDER — SODIUM CHLORIDE 0.9 % IV SOLN
INTRAVENOUS | Status: DC | PRN
  Administered 2019-07-22: 500 mL

## 2019-07-22 MED ORDER — ORAL CARE MOUTH RINSE: 15.0000 mL | OROMUCOSAL | Status: DC

## 2019-07-22 MED ORDER — FENTANYL BOLUS VIA INFUSION
25.0000 ug | INTRAVENOUS | Status: DC | PRN
  Filled 2019-07-22: qty 25

## 2019-07-22 MED ORDER — ONDANSETRON HCL 4 MG PO TABS: 4.0000 mg | ORAL_TABLET | ORAL | Status: DC | PRN

## 2019-07-22 MED ORDER — THROMBIN 5000 UNITS EX SOLR
CUTANEOUS | Status: AC
  Filled 2019-07-22: qty 5000

## 2019-07-22 MED ORDER — LABETALOL HCL 5 MG/ML IV SOLN: 10.0000 mg | INTRAVENOUS | Status: DC | PRN

## 2019-07-22 MED ORDER — SODIUM CHLORIDE 0.9 % IV SOLN: 10.0000 mL/h | Freq: Once | INTRAVENOUS | Status: DC

## 2019-07-22 MED ORDER — EPINEPHRINE HCL 5 MG/250ML IV SOLN IN NS
0.5000 ug/min | INTRAVENOUS | Status: DC
  Filled 2019-07-22: qty 250

## 2019-07-22 MED ORDER — MICROFIBRILLAR COLL HEMOSTAT EX PADS
MEDICATED_PAD | CUTANEOUS | Status: DC | PRN
  Administered 2019-07-22: 1 via TOPICAL

## 2019-07-22 MED ORDER — MANNITOL 25 % IV SOLN
INTRAVENOUS | Status: DC | PRN
  Administered 2019-07-22: 25 g via INTRAVENOUS

## 2019-07-22 MED ORDER — POTASSIUM CHLORIDE 10 MEQ/100ML IV SOLN
10.0000 meq | INTRAVENOUS | Status: AC
  Administered 2019-07-22 (×4): 10 meq via INTRAVENOUS
  Filled 2019-07-22 (×4): qty 100

## 2019-07-22 MED ORDER — ONDANSETRON HCL 4 MG/2ML IJ SOLN
INTRAMUSCULAR | Status: DC | PRN
  Administered 2019-07-22: 4 mg via INTRAVENOUS

## 2019-07-22 MED ORDER — VASOPRESSIN 20 UNIT/ML IV SOLN
0.0300 [IU]/min | INTRAVENOUS | Status: DC
  Administered 2019-07-22: 0.05 [IU]/min via INTRAVENOUS
  Filled 2019-07-22: qty 2

## 2019-07-22 MED ORDER — ONDANSETRON HCL 4 MG/2ML IJ SOLN: 4.0000 mg | INTRAMUSCULAR | Status: DC | PRN

## 2019-07-22 MED ORDER — MICROFIBRILLAR COLL HEMOSTAT EX POWD
CUTANEOUS | Status: AC
  Filled 2019-07-22: qty 5

## 2019-07-22 MED ORDER — PROMETHAZINE HCL 25 MG PO TABS: 12.5000 mg | ORAL_TABLET | ORAL | Status: DC | PRN

## 2019-07-22 MED ORDER — CALCIUM CHLORIDE 10 % IV SOLN
INTRAVENOUS | Status: DC | PRN
  Administered 2019-07-22: 100 mg via INTRAVENOUS
  Administered 2019-07-22 (×2): 1000 mg via INTRAVENOUS
  Administered 2019-07-22 (×3): 200 mg via INTRAVENOUS
  Administered 2019-07-22: 400 mg via INTRAVENOUS
  Administered 2019-07-22: 200 mg via INTRAVENOUS
  Administered 2019-07-22: 100 mg via INTRAVENOUS
  Administered 2019-07-22: 700 mg via INTRAVENOUS
  Administered 2019-07-22 (×2): 200 mg via INTRAVENOUS
  Administered 2019-07-22: 100 mg via INTRAVENOUS

## 2019-07-22 MED ORDER — LIDOCAINE-EPINEPHRINE 1 %-1:100000 IJ SOLN
INTRAMUSCULAR | Status: AC
  Filled 2019-07-22: qty 1

## 2019-07-22 MED ORDER — PROPOFOL 10 MG/ML IV BOLUS
INTRAVENOUS | Status: AC
  Filled 2019-07-22: qty 20

## 2019-07-22 MED ORDER — FENTANYL CITRATE (PF) 250 MCG/5ML IJ SOLN
INTRAMUSCULAR | Status: DC | PRN
  Administered 2019-07-22: 50 ug via INTRAVENOUS

## 2019-07-22 MED ORDER — VASOPRESSIN 20 UNIT/ML IV SOLN
0.0300 [IU]/min | Freq: Once | INTRAVENOUS | Status: DC
  Filled 2019-07-22: qty 2

## 2019-07-22 MED ORDER — MORPHINE SULFATE (PF) 2 MG/ML IV SOLN: 1.0000 mg | INTRAVENOUS | Status: DC | PRN

## 2019-07-22 MED ORDER — SODIUM CHLORIDE 0.9 % IV SOLN: INTRAVENOUS | Status: DC | PRN

## 2019-07-22 MED ORDER — SUCCINYLCHOLINE CHLORIDE 20 MG/ML IJ SOLN
INTRAMUSCULAR | Status: DC | PRN
  Administered 2019-07-22: 120 mg via INTRAVENOUS

## 2019-07-22 MED ORDER — LEVETIRACETAM IN NACL 500 MG/100ML IV SOLN: 500.0000 mg | Freq: Two times a day (BID) | INTRAVENOUS | Status: DC

## 2019-07-22 MED ORDER — ORAL CARE MOUTH RINSE
15.0000 mL | OROMUCOSAL | Status: DC
  Administered 2019-07-22 – 2019-07-23 (×2): 15 mL via OROMUCOSAL

## 2019-07-22 MED ORDER — VASOPRESSIN 20 UNIT/ML IV SOLN
INTRAVENOUS | Status: AC
  Filled 2019-07-22: qty 1

## 2019-07-22 MED ORDER — PROPOFOL 1000 MG/100ML IV EMUL
0.0000 ug/kg/min | INTRAVENOUS | Status: DC
  Administered 2019-07-22: 10 ug/kg/min via INTRAVENOUS

## 2019-07-22 MED ORDER — ROCURONIUM BROMIDE 10 MG/ML (PF) SYRINGE
PREFILLED_SYRINGE | INTRAVENOUS | Status: AC
  Filled 2019-07-22: qty 10

## 2019-07-22 MED ORDER — 0.9 % SODIUM CHLORIDE (POUR BTL) OPTIME
TOPICAL | Status: DC | PRN
  Administered 2019-07-22 (×2): 1000 mL

## 2019-07-22 MED ORDER — NOREPINEPHRINE 4 MG/250ML-% IV SOLN
0.0000 ug/min | INTRAVENOUS | Status: DC
  Administered 2019-07-22: 20 ug/min via INTRAVENOUS
  Administered 2019-07-22: 15 ug/min via INTRAVENOUS
  Filled 2019-07-22 (×2): qty 250

## 2019-07-22 MED ORDER — BACITRACIN ZINC 500 UNIT/GM EX OINT
TOPICAL_OINTMENT | CUTANEOUS | Status: AC
  Filled 2019-07-22: qty 28.35

## 2019-07-22 MED ORDER — SODIUM BICARBONATE 8.4 % IV SOLN
INTRAVENOUS | Status: DC | PRN
  Administered 2019-07-22: 50 meq via INTRAVENOUS

## 2019-07-22 MED ORDER — ROCURONIUM BROMIDE 10 MG/ML (PF) SYRINGE
PREFILLED_SYRINGE | INTRAVENOUS | Status: DC | PRN
  Administered 2019-07-22: 100 mg via INTRAVENOUS
  Administered 2019-07-22: 20 mg via INTRAVENOUS
  Administered 2019-07-22: 50 mg via INTRAVENOUS

## 2019-07-22 MED ORDER — SUCCINYLCHOLINE CHLORIDE 200 MG/10ML IV SOSY
PREFILLED_SYRINGE | INTRAVENOUS | Status: AC
  Filled 2019-07-22: qty 10

## 2019-07-22 MED ORDER — PROPOFOL 10 MG/ML IV BOLUS
INTRAVENOUS | Status: DC | PRN
  Administered 2019-07-22: 80 mg via INTRAVENOUS

## 2019-07-22 MED ORDER — VANCOMYCIN HCL IN DEXTROSE 1-5 GM/200ML-% IV SOLN: 1000.0000 mg | Freq: Once | INTRAVENOUS | Status: DC

## 2019-07-22 MED ORDER — MIDAZOLAM HCL 2 MG/2ML IJ SOLN
INTRAMUSCULAR | Status: AC
  Filled 2019-07-22: qty 2

## 2019-07-22 MED ORDER — LIDOCAINE 2% (20 MG/ML) 5 ML SYRINGE
INTRAMUSCULAR | Status: DC | PRN
  Administered 2019-07-22: 100 mg via INTRAVENOUS

## 2019-07-22 MED ORDER — VASOPRESSIN 20 UNIT/ML IV SOLN
INTRAVENOUS | Status: DC | PRN
  Administered 2019-07-22: .03 [IU]/min via INTRAVENOUS

## 2019-07-22 MED ORDER — THROMBIN 20000 UNITS EX SOLR
CUTANEOUS | Status: DC | PRN
  Administered 2019-07-22: 20 mL via TOPICAL

## 2019-07-22 SURGICAL SUPPLY — 71 items
BAG DECANTER FOR FLEXI CONT (MISCELLANEOUS) IMPLANT
BATTERY IQ STERILE (MISCELLANEOUS) IMPLANT
BIT DRILL WIRE PASS 1.3MM (BIT) IMPLANT
BLADE CLIPPER SPEC (BLADE) ×3 IMPLANT
BUR PRECISION FLUTE 6.0 (BURR) ×3 IMPLANT
BUR SPIRAL ROUTER 2.3 (BUR) IMPLANT
BUR SPIRAL ROUTER 2.3MM (BUR)
CANISTER SUCT 3000ML PPV (MISCELLANEOUS) ×3 IMPLANT
CARTRIDGE OIL MAESTRO DRILL (MISCELLANEOUS) ×1 IMPLANT
CATH VENTRIC 35X38 W/TROCAR LG (CATHETERS) ×3 IMPLANT
CLIP ANEURY TI PERM 5.0 STD 6M (Clip) ×3 IMPLANT
CLIP ANEURY TI PERM STD STR 11 (Clip) ×9 IMPLANT
CLIP ANEURY TI PERM STD STR 9M (Clip) ×6 IMPLANT
CLIP ANEURY TI TEMP MINI STR 7 (Clip) ×3 IMPLANT
CLIP VESOCCLUDE MED 6/CT (CLIP) IMPLANT
COVER BACK TABLE 60X90IN (DRAPES) IMPLANT
COVER WAND RF STERILE (DRAPES) ×3 IMPLANT
DIFFUSER DRILL AIR PNEUMATIC (MISCELLANEOUS) ×3 IMPLANT
DRAPE NEUROLOGICAL W/INCISE (DRAPES) ×3 IMPLANT
DRAPE SURG 17X23 STRL (DRAPES) IMPLANT
DRAPE WARM FLUID 44X44 (DRAPES) ×3 IMPLANT
DRILL WIRE PASS 1.3MM (BIT)
ELECT REM PT RETURN 9FT ADLT (ELECTROSURGICAL) ×3
ELECTRODE REM PT RTRN 9FT ADLT (ELECTROSURGICAL) ×1 IMPLANT
EVACUATOR 1/8 PVC DRAIN (DRAIN) IMPLANT
EVACUATOR SILICONE 100CC (DRAIN) IMPLANT
FORCEPS BIPOLAR SPETZLER 8 1.0 (NEUROSURGERY SUPPLIES) ×3 IMPLANT
GAUZE 4X4 16PLY RFD (DISPOSABLE) IMPLANT
GAUZE SPONGE 4X4 12PLY STRL (GAUZE/BANDAGES/DRESSINGS) ×3 IMPLANT
GLOVE BIO SURGEON STRL SZ7 (GLOVE) ×9 IMPLANT
GLOVE BIO SURGEON STRL SZ8 (GLOVE) ×3 IMPLANT
GLOVE BIO SURGEON STRL SZ8.5 (GLOVE) ×3 IMPLANT
GLOVE BIOGEL PI IND STRL 7.5 (GLOVE) ×1 IMPLANT
GLOVE BIOGEL PI INDICATOR 7.5 (GLOVE) ×2
GLOVE EXAM NITRILE XL STR (GLOVE) IMPLANT
GOWN STRL REUS W/ TWL LRG LVL3 (GOWN DISPOSABLE) IMPLANT
GOWN STRL REUS W/ TWL XL LVL3 (GOWN DISPOSABLE) IMPLANT
GOWN STRL REUS W/TWL LRG LVL3 (GOWN DISPOSABLE)
GOWN STRL REUS W/TWL XL LVL3 (GOWN DISPOSABLE)
KIT BASIN OR (CUSTOM PROCEDURE TRAY) ×3 IMPLANT
KIT TURNOVER KIT B (KITS) ×3 IMPLANT
MARKER SKIN DUAL TIP RULER LAB (MISCELLANEOUS) IMPLANT
NEEDLE HYPO 22GX1.5 SAFETY (NEEDLE) ×3 IMPLANT
NS IRRIG 1000ML POUR BTL (IV SOLUTION) ×3 IMPLANT
OIL CARTRIDGE MAESTRO DRILL (MISCELLANEOUS) ×3
PACK BATTERY CMF DISP FOR DVR (ORTHOPEDIC DISPOSABLE SUPPLIES) ×3 IMPLANT
PACK CRANIOTOMY CUSTOM (CUSTOM PROCEDURE TRAY) ×3 IMPLANT
PAD ARMBOARD 7.5X6 YLW CONV (MISCELLANEOUS) ×3 IMPLANT
PATTIES SURGICAL .25X.25 (GAUZE/BANDAGES/DRESSINGS) IMPLANT
PATTIES SURGICAL .5 X.5 (GAUZE/BANDAGES/DRESSINGS) ×3 IMPLANT
PATTIES SURGICAL .5 X3 (DISPOSABLE) ×6 IMPLANT
PATTIES SURGICAL 1X1 (DISPOSABLE) ×6 IMPLANT
PIN MAYFIELD SKULL DISP (PIN) IMPLANT
PLATE CRANIAL 12 2H RIGID UNI (Plate) ×3 IMPLANT
RUBBERBAND STERILE (MISCELLANEOUS) ×6 IMPLANT
SCREW UNIII AXS SD 1.5X4 (Screw) ×18 IMPLANT
SPONGE NEURO XRAY DETECT 1X3 (DISPOSABLE) ×3 IMPLANT
STAPLER SKIN PROX WIDE 3.9 (STAPLE) ×3 IMPLANT
SUT ETHILON 3 0 FSL (SUTURE) IMPLANT
SUT ETHILON O TP 1 (SUTURE) ×3 IMPLANT
SUT NURALON 4 0 TR CR/8 (SUTURE) ×6 IMPLANT
SUT PROLENE 6 0 BV (SUTURE) IMPLANT
SUT VIC AB 2-0 CP2 18 (SUTURE) ×3 IMPLANT
SUT VIC AB 3-0 FS2 27 (SUTURE) ×3 IMPLANT
SUT VICRYL 4-0 PS2 18IN ABS (SUTURE) IMPLANT
TAPE PAPER 3X10 WHT MICROPORE (GAUZE/BANDAGES/DRESSINGS) ×3 IMPLANT
TOWEL GREEN STERILE (TOWEL DISPOSABLE) ×3 IMPLANT
TOWEL GREEN STERILE FF (TOWEL DISPOSABLE) ×3 IMPLANT
TRAY FOLEY MTR SLVR 16FR STAT (SET/KITS/TRAYS/PACK) IMPLANT
UNDERPAD 30X30 (UNDERPADS AND DIAPERS) IMPLANT
WATER STERILE IRR 1000ML POUR (IV SOLUTION) ×3 IMPLANT

## 2019-07-22 NOTE — Progress Notes (Signed)
Pt newly incontinent last night/this morning & not following commands w/ LUE, along with confusion & increased lethargy from yesterday.  Discussed w/ Hildred Priest NP.  Will continue to monitor.

## 2019-07-22 NOTE — Anesthesia Procedure Notes (Addendum)
Central Venous Catheter Insertion Performed by: Lewie Loron, MD, anesthesiologist Patient location: Pre-op. Preanesthetic checklist: patient identified, IV checked, site marked, risks and benefits discussed, surgical consent, monitors and equipment checked, pre-op evaluation, timeout performed and anesthesia consent Lidocaine 1% used for infiltration and patient sedated Hand hygiene performed  and Seldinger technique used Catheter size: 8 Fr Total catheter length 16. Central line was placed.Double lumen Procedure performed using ultrasound guided technique. Ultrasound Notes:anatomy identified, needle tip was noted to be adjacent to the nerve/plexus identified, no ultrasound evidence of intravascular and/or intraneural injection and image(s) printed for medical record Attempts: 1 Following insertion, dressing applied, line sutured and Biopatch. Post procedure assessment: blood return through all ports, free fluid flow and no air  Patient tolerated the procedure well with no immediate complications. Additional procedure comments: Placed in code situation under best possible sterile conditions while chest compressions were being performed. Marland Kitchen

## 2019-07-22 NOTE — Consult Note (Signed)
Spoke with son at bedside, offering spiritual/ grief support and prayer, which he appreciated. He said his children were here and his brother is on the way. I invited him to ask the nurse to page for a chaplain again if they had further need of chaplain services. The family is [place], and he said his mother attended a [employer].   Rev. Donnel Saxon Chaplain

## 2019-07-22 NOTE — Anesthesia Procedure Notes (Signed)
Arterial Line Insertion Performed by: Lewie Loron, MD  Patient location: Pre-op. Preanesthetic checklist: patient identified, IV checked, site marked, risks and benefits discussed, surgical consent, monitors and equipment checked, pre-op evaluation, timeout performed and anesthesia consent Lidocaine 1% used for infiltration Left, radial was placed Catheter size: 20 Fr Hand hygiene performed  and maximum sterile barriers used   Attempts: 1 (3 prior attempts by CRNA) Procedure performed using ultrasound guided technique. Ultrasound Notes:anatomy identified, needle tip was noted to be adjacent to the nerve/plexus identified, no ultrasound evidence of intravascular and/or intraneural injection and image(s) printed for medical record Following insertion, dressing applied and Biopatch. Post procedure assessment: normal and unchanged  Patient tolerated the procedure well with no immediate complications.

## 2019-07-22 NOTE — Consult Note (Signed)
NAME:  Sara Adams, MRN:  [id number], DOB:  October 26, 1945, LOS: 4 ADMISSION DATE:  07/17/2019, CONSULTATION DATE:  07/21/2019 REFERRING MD:  Arnoldo Morale- NSGY, CHIEF COMPLAINT:  Vent management, shock   Brief History    74 yo POD 4 crani for AVM resection (op date 3/23), with occurrence of rebleed + SDH 3/27. Taken back to OR 3/27 for repeat crani, hematoma evac. Unfortunately, op course complicated by hemorrhage, shock, and subsequent cardiac arrest with ROSC.  NSGY indicates prognosis is extremely poor and has asked for PCCM involvemnet for vent management at end of life.  History of present illness   74 yo F who underwent craniotomy 3/23 for AVM resection. Post op course complicated by seizure activity and progressive lethargy, AMS with subsequent days. On 3/27 patient CT revealed rebleed and new SDH. Return trip to OR emergently for hematoma evan 3/27, unfortunately complicated by hemorrhage and intraop cardiac arrest. ROSC after approx 15 minutes. 3 amp epi given, multiple amp bicarb. Patient MTP receiving >20 products intra op for hemorrhage.   Arrives to ICU intubated and on 0.05 vaso and 15 NE   Past Medical History  AVM Depression HA Arthritis  Cough Myalgia   Significant Hospital Events   3/23 crani for AVM resection 3/27 repeat crani for hematoma eval. Intra op hemorrhage, cardiac arrest with ROSC.   Consults:  Neurology  PCCM  Procedures:  3/23 crani for AVM resection 3/27 repeat crani for hematoma evac   Significant Diagnostic Tests:  3/27 CT H> acute hemorrage R temporal lobe 3.5x7cm, additional 75mm R frontotemporal SDH. Moderate subdural gas bilateral frontal lobes. Mass effect of R hemisphere with mild 67mm midline shift. Small amount of blood in bilateral occipital horns.   Micro Data:    Antimicrobials:    Interim history/subjective:  74 yo POD4  crani for AVM resection (op date 3/23), with rebleed 3/27 Intraoperative cardiac arrest with ROSC. Critically ill and  unfortunately does not sound like present illness is survivable. PCCM consulted urgently by NSGY for vent management to allow family to be with patient post-op, at EOL   Objective   Blood pressure (!) 81/54, pulse 80, temperature 99.9 F (37.7 C), temperature source Axillary, resp. rate (!) 24, height 5\' 6"  (1.676 m), weight 65.8 kg, SpO2 97 %.        Intake/Output Summary (Last 24 hours) at 07/07/2019 1550 Last data filed at 07/15/2019 1545 Gross per 24 hour  Intake 9685.31 ml  Output 14875 ml  Net -5189.69 ml   Filed Weights   07/08/2019 0602 07/19/19 0800  Weight: 65.8 kg 65.8 kg    Examination: General: Critically ill appearing older adult F, intubated sedated, NAD on mechanical ventilator  HENT: NCAT pink pale mm. ETT secure with copious amounts of pink frothy sputum.  Lungs: Bilateral crackles. Symmetrical chest expansion. Compliant with vent  Cardiovascular: Tachycardic rate regular rhythm. s1s2 no rgm. Abdomen: soft flat ndnt. Hypoactive bowel sounds  Extremities: Symmetrical bulk and tone, no cyanosis or clubbing Neuro: Sedated RASS -5.  GU: Defer   Resolved Hospital Problem list     Assessment & Plan:   POD4 crani for AVM resection, op date 3/23 -3/27 unfortunately with rebleed, large temporal hematoma and early midline shift with herniation P -returned to OR 3/27 and unfortunately bleed is not felt to be correctable  -Poor prognosis communicated to family by primary team and myself  -Plan to transition to comfort care when family arrives  Acute respiratory failure requiring mechanical ventilation -remains intubated  post op P -Continue MV for now but do not see utility in ABG, CXR at this time -Approaching end of life; will plan for compassionate extubation/comfort care  when family arrives 3/27 -Prop, fent gtts, PRN versed   S/p Cardiac arrest with ROSC -arrest intraoperatively, likely catalyzed by hemorrhage  P -not candidate for TTM  -supportive care  prior to comfort measures   Shock -likely predominantly hemorrhagic,possible components of neurogenic vs cardiogenic  P -Continue present pressors and IVF bolus in effort to temporize prior to family arrival for comfort care -Titrate NE for MAP > 65.   Hypokalemia Hypocalcemia P -related to MTP  -Will defer replacing at this time with expected transition to comfort care   Goals of Care -in discussion with primary team, current course unfortunately does not sound survivable.  -I have discussed with patient's son, as has primary team NSGY. Code status now DNR. Plan for transition to comfort care when family arrives.  -For comfort care, will plan for fent gtt with PRN BZD and available low dose prop gtt for comfort given seizures following initial crani, risk of seizures with subsequent surgery.  -Will de-escalate measures not aimed at comfort such as labs and imaging.  -Chaplain paged for additional spiritual support in this challenging time   Best practice:  Diet: NPO Pain/Anxiety/Delirium protocol (if indicated): prop, fent, PRN versed  VAP protocol (if indicated): yes DVT prophylaxis: no, active intracranial bleed  GI prophylaxis: protonix  Glucose control: monitor Mobility: BR Code Status: Full at time of consult, recommend DNR  Family Communication: Long discussion with family at bedside, NSGY has also updated.  Disposition: ICU   Labs   CBC: Recent Labs  Lab 07/02/2019 0648 07/07/2019 0648 07/22/2019 1024 07/12/2019 1253 07/19/19 0458 07/21/19 1612 August 05, 2019 1431  WBC 5.6  --   --   --  13.9* 8.2 11.8*  NEUTROABS 3.0  --   --   --   --   --   --   HGB 13.0   < > 11.9* 10.2* 9.1* 8.9* 10.8*  HCT 40.3   < > 35.0* 30.0* 27.4* 25.7* 33.1*  MCV 95.5  --   --   --  95.1 91.8 87.6  PLT 212  --   --   --  216 205 64*   < > = values in this interval not displayed.    Basic Metabolic Panel: Recent Labs  Lab 07/15/2019 0648 07/14/2019 1024 07/11/2019 1253 07/19/19 0458  07/21/19 1612 08/05/19 1134 Aug 05, 2019 1432  NA 140   < > 139 141 138 135 138  K 3.8   < > 3.9 3.3* 2.3* 2.9* 4.9  CL 106  --   --  109 98 97* 103  CO2 26  --   --  23 28 24  19*  GLUCOSE 102*  --   --  125* 113* 112* 273*  BUN 12  --   --  5* <5* <5* 8  CREATININE 0.37*  --   --  0.43* 0.48 0.52 0.58  CALCIUM 9.0  --   --  7.9* 8.4* 8.4* 10.2   < > = values in this interval not displayed.   GFR: Estimated Creatinine Clearance: 57.8 mL/min (by C-G formula based on SCr of 0.58 mg/dL). Recent Labs  Lab 06/30/2019 0648 07/19/19 0458 07/21/19 1612 Aug 05, 2019 1431  WBC 5.6 13.9* 8.2 11.8*    Liver Function Tests: No results for input(s): AST, ALT, ALKPHOS, BILITOT, PROT, ALBUMIN in the last 168 hours.  No results for input(s): LIPASE, AMYLASE in the last 168 hours. No results for input(s): AMMONIA in the last 168 hours.  ABG    Component Value Date/Time   PHART 7.309 (L) 07-30-19 1253   PCO2ART 49.4 (H) 07/30/2019 1253   PO2ART 177.0 (H) July 30, 2019 1253   HCO3 24.6 07-30-19 1253   TCO2 26 07/30/2019 1253   ACIDBASEDEF 2.0 07-30-19 1253   O2SAT 99.0 07/30/19 1253     Coagulation Profile: Recent Labs  Lab 07-30-19 0648  INR 1.0    Cardiac Enzymes: No results for input(s): CKTOTAL, CKMB, CKMBINDEX, TROPONINI in the last 168 hours.  HbA1C: No results found for: HGBA1C  CBG: No results for input(s): GLUCAP in the last 168 hours.  Review of Systems:   Unable to obtain, intubated sedated   Past Medical History  She,  has a past medical history of Arthritis, Bruises easily, Cough, Depression, Frequent headaches, Hearing loss, Muscle pain, Pneumonia, Sinus problem, and Swelling.   Surgical History    Past Surgical History:  Procedure Laterality Date  . APPENDECTOMY    . APPLICATION OF CRANIAL NAVIGATION N/A 2019-07-30   Procedure: APPLICATION OF CRANIAL NAVIGATION;  Surgeon: Lisbeth Renshaw, MD;  Location: [facility] OR;  Service: Neurosurgery;  Laterality: N/A;   APPLICATION OF CRANIAL NAVIGATION  . CRANIOTOMY Right 07-30-2019   Procedure: RIGHT CRANIOTOMY FOR RESECTION OF INTRACRANIAL ARTERIO-VENOUS MALFORMATION;  Surgeon: Lisbeth Renshaw, MD;  Location: [facility] OR;  Service: Neurosurgery;  Laterality: Right;  RIGHT CRANIOTOMY FOR RESECTION OF INTRACRANIAL ARTERIO-VENOUS MALFORMATION  . IR ANGIO INTRA EXTRACRAN SEL COM CAROTID INNOMINATE UNI R MOD SED  05/16/2019  . IR ANGIO INTRA EXTRACRAN SEL COM CAROTID INNOMINATE UNI R MOD SED  07/19/2019  . TONSILLECTOMY    . tonsills    . TUBAL LIGATION       Social History   reports that she has been smoking cigarettes. She has been smoking about 1.00 pack per day. She has never used smokeless tobacco. She reports that she does not drink alcohol or use drugs.   Family History   Her family history is not on file.   Allergies Allergies  Allergen Reactions  . Asa [Aspirin]     UNSPECIFIED REACTION   . Codeine     UNSPECIFIED REACTION   . Penicillins     UNSPECIFIED REACTION   . Sulfa Antibiotics     UNSPECIFIED REACTION      Home Medications  Prior to Admission medications   Medication Sig Start Date End Date Taking? Authorizing Provider  ALPRAZolam Prudy Feeler) 0.5 MG tablet Take 0.5 mg by mouth 2 (two) times daily as needed for anxiety.  01/25/13  Yes [provider]  Cholecalciferol 25 MCG (1000 UT) tablet Take 1,000 Units by mouth daily.   Yes [provider]  ibuprofen (ADVIL) 200 MG tablet Take 200-400 mg by mouth every 6 (six) hours as needed for headache or moderate pain.    Yes [provider]     Critical care time: 67 minute     Tessie Fass MSN, AGACNP-BC [facility] [phone] If no answer, [phone] 07/12/2019, 3:50 PM

## 2019-07-22 NOTE — Progress Notes (Signed)
I received a call from Dr. Chestine Spore regarding this patient's CT scan.  I reviewed her CT scan.  It demonstrates a large right temporal hematoma with some midline shift/early herniation.  I discussed the situation with the patient's son as the patient is not presently able to consent on her behalf.  We discussed the various treatment options including optimal medical management versus a repeat craniotomy to evacuate the hematoma.  I recommend a little the latter.  I described the surgery and the risks including the risk of anesthesia, hemorrhage, infection, seizures, medical risk, etc.  I have answered all his questions.  He has consented on behalf of the patient and is on his way to the hospital.  I have posted the case emergently.

## 2019-07-22 NOTE — Progress Notes (Signed)
Pharmacy Antibiotic Note  Sara Adams is a 74 y.o. female admitted on Aug 05, 2019 with surgical prophylaxis.  Pharmacy has been consulted for vancomycin dosing.  Received vancomycin 1g at 1218 today.  Plan: Vancomycin 1g x 1 additional dose at midnight tonight.  Height: 5\' 6"  (167.6 cm) Weight: 145 lb (65.8 kg) IBW/kg (Calculated) : 59.3  Temp (24hrs), Avg:99.2 F (37.3 C), Min:98.9 F (37.2 C), Max:99.9 F (37.7 C)  Recent Labs  Lab 2019-08-05 0648 Aug 05, 2019 0648 07/19/19 0458 07/19/19 0458 07/21/19 1612 06/28/2019 1134 07/21/2019 1424 07/25/2019 1431 07/21/2019 1432 07/07/2019 1500  WBC 5.6  --  13.9*  --  8.2  --   --  11.8*  --   --   CREATININE 0.37*   < > 0.43*   < > 0.48 0.52 0.30*  --  0.58 0.30*   < > = values in this interval not displayed.    Estimated Creatinine Clearance: 57.8 mL/min (A) (by C-G formula based on SCr of 0.3 mg/dL (L)).    Allergies  Allergen Reactions  . Asa [Aspirin]     UNSPECIFIED REACTION   . Codeine     UNSPECIFIED REACTION   . Penicillins     UNSPECIFIED REACTION   . Sulfa Antibiotics     UNSPECIFIED REACTION     Thank you for allowing pharmacy to be a part of this patient's care.  07/24/19, [facility] Clinical Pharmacist Phone [phone]  07/06/2019 6:09 PM

## 2019-07-22 NOTE — Progress Notes (Signed)
Subjective: By report the patient is less alert.  She is in no apparent distress.  Objective: Vital signs in last 24 hours: Temp:  [98.7 F (37.1 C)-99.9 F (37.7 C)] 99.9 F (37.7 C) (03/27 0800) Pulse Rate:  [69-112] 73 (03/27 0800) Resp:  [20-35] 24 (03/27 0800) BP: (68-112)/(43-71) 90/53 (03/27 0800) SpO2:  [92 %-100 %] 97 % (03/27 0800) Estimated body mass index is 23.4 kg/m as calculated from the following:   Height as of this encounter: 5\' 6"  (1.676 m).   Weight as of this encounter: 65.8 kg.   Intake/Output from previous day: 03/26 0701 - 03/27 0700 In: 1368.7 [I.V.:918.4; IV Piggyback:450.3] Out: 1475 [Urine:1475] Intake/Output this shift: Total I/O In: 74.5 [I.V.:74.5] Out: -   Physical exam Glasgow Coma Scale 13, E3M6V4.  She thinks he is at home.  She is left hemiparetic.  She follows commands on the right.  Her pupils are equal.  Lab Results: Recent Labs    07/21/19 1612  WBC 8.2  HGB 8.9*  HCT 25.7*  PLT 205   BMET Recent Labs    07/21/19 1612  NA 138  K 2.3*  CL 98  CO2 28  GLUCOSE 113*  BUN <5*  CREATININE 0.48  CALCIUM 8.4*    Studies/Results: No results found.  Assessment/Plan: Status post resection of AVM: By report she is more somnolent.  I will check a head CT.  Her sodium is okay.  LOS: 4 days     06/27/2019 07/08/2019, 9:10 AM

## 2019-07-22 NOTE — Progress Notes (Signed)
SLP Cancellation Note  Patient Details Name: Sara Adams MRN: [id number] DOB: 21-Mar-1946   Cancelled treatment:       Reason Eval/Treat Not Completed: Patient at procedure or test/unavailable   Tressie Stalker, M.S., CCC-SLP 08/02/19, 11:25 AM

## 2019-07-22 NOTE — Anesthesia Preprocedure Evaluation (Signed)
Anesthesia Evaluation  Patient identified by MRN, date of birth, ID band Patient unresponsive    Reviewed: Allergy & Precautions, NPO status , Patient's Chart, lab work & pertinent test resultsPreop documentation limited or incomplete due to emergent nature of procedure.  History of Anesthesia Complications Negative for: history of anesthetic complications  Airway Mallampati: II  TM Distance: >3 FB Neck ROM: Full    Dental  (+) Upper Dentures, Lower Dentures   Pulmonary Current Smoker and Patient abstained from smoking.,    Pulmonary exam normal        Cardiovascular negative cardio ROS Normal cardiovascular exam     Neuro/Psych Intracranial AV malformation negative psych ROS   GI/Hepatic negative GI ROS, Neg liver ROS,   Endo/Other  negative endocrine ROS  Renal/GU negative Renal ROS  negative genitourinary   Musculoskeletal negative musculoskeletal ROS (+)   Abdominal   Peds  Hematology negative hematology ROS (+)   Anesthesia Other Findings   Reproductive/Obstetrics                             Anesthesia Physical  Anesthesia Plan  ASA: IV and emergent  Anesthesia Plan: General   Post-op Pain Management:    Induction: Intravenous and Rapid sequence  PONV Risk Score and Plan: 2 and Ondansetron, Dexamethasone and Treatment may vary due to age or medical condition  Airway Management Planned: Oral ETT  Additional Equipment: Arterial line  Intra-op Plan:   Post-operative Plan: Possible Post-op intubation/ventilation  Informed Consent: I have reviewed the patients History and Physical, chart, labs and discussed the procedure including the risks, benefits and alternatives for the proposed anesthesia with the patient or authorized representative who has indicated his/her understanding and acceptance.     Dental advisory given  Plan Discussed with: CRNA  Anesthesia Plan  Comments: (Art line, large bore PIV x2)        Anesthesia Quick Evaluation

## 2019-07-22 NOTE — Anesthesia Procedure Notes (Signed)
Procedure Name: Intubation Date/Time: 07/06/2019 12:14 PM Performed by: Adria Dill, CRNA Pre-anesthesia Checklist: Patient identified, Emergency Drugs available, Suction available and Patient being monitored Patient Re-evaluated:Patient Re-evaluated prior to induction Oxygen Delivery Method: Circle system utilized Preoxygenation: Pre-oxygenation with 100% oxygen Induction Type: IV induction, Rapid sequence and Cricoid Pressure applied Ventilation: Mask ventilation without difficulty Laryngoscope Size: Miller and 2 Grade View: Grade I Tube type: Oral Tube size: 7.5 mm Number of attempts: 1 Airway Equipment and Method: Stylet and Oral airway Placement Confirmation: ETT inserted through vocal cords under direct vision,  positive ETCO2 and breath sounds checked- equal and bilateral Secured at: 22 cm Tube secured with: Tape Dental Injury: Teeth and Oropharynx as per pre-operative assessment

## 2019-07-22 NOTE — OR Nursing (Signed)
Emergency craniotomy performed. Pt. Began bleeding profusely. Aneurysm clips of various sizes placed emergently. Once bleeding was mildly controlled some aneurysm clips were removed. Dr. Lovell Sheehan was unable to determine which clips were left in and which were removed, because some aneurysm clips were evacuated by the suction and others went with specimen to pathology.

## 2019-07-22 NOTE — Progress Notes (Signed)
PCCM Brief Progress Note  Family has been slowly arriving for final goodbyes with patient. I have provided updates regarding clinical course as well as unfortunate prognosis. Family is very accepting that this is sadly not a survivable course, and understands patient is presently critically ill, on multiple pressors and mechanical ventilation. Pt is on Propofol and fentanyl infusions, and has began to exhibit focal rhythmic twitching, concerning for possible seizure (pt exhibited similar in initial post op period and was started on Keppra). Discussed with RN for admin of PRN BZD vs uptitration of propofol.  The plan is to transition to transition to comfort care when remaining family is able to visit. I have discussed in detail with family members what comfort care entails and I feel the process is well understood.   For now,  -ok to continue pressors and vent support, without escalation of pressor doses. Dc when comfort -No labs, imaging, abx -DNR in event of arrest -Continue Fent and prop gtts, with PRN BZDs.  When family ready for compassionate extubation and comfort care tonight, I recommend extubation on both fentanyl gtt and propofol gtt due to suspected seizure activity, with PRN BZDs available as well.     Tessie Fass MSN, AGACNP-BC [facility] [phone] If no answer, [phone] 07/19/2019, 7:01 PM

## 2019-07-22 NOTE — OR Nursing (Signed)
Incorrect count due to missing 1/2x1/2 patty. The patty separated from the string and Dr. Lovell Sheehan search the wound for the patty. X-Ray was called. Dr. Sharlet Salina reported  he did not see the patty on the x-ray, but not all areas of the surgical site could be seen due to the positioning frame.

## 2019-07-22 NOTE — Progress Notes (Signed)
CDS Referral # [id number]

## 2019-07-22 NOTE — Op Note (Signed)
Brief history: The patient is a 74 year old white female who presented with seizures and a right temporal AVM.  Dr. Conchita Paris performed a craniotomy for resection of this AVM on 07/15/2019.  By report the patient initially did well but began having mental status changes.  When I saw her this morning she was somnolent, mumbling, and left hemiplegic.  A follow-up head CT scan demonstrated a large temporal subdural hematoma with midline shift and early uncal herniation.  I discussed the situation with the patient's son, Jinger Neighbors.  We discussed the various treatment options.  I recommend a repeat craniotomy for evacuation of the hematoma.  I explained the surgery, the risks, benefits and alternatives.  I answered all his questions.  He consented on behalf of his mother.  Preop diagnosis: Right temporal intracerebral hemorrhage, subdural hematoma  Postop diagnosis: The same and arteriovenous malformation  Procedure: Right temporal craniotomy for evacuation of temporal hematoma and resection of residual arteriovenous malformation  Surgeon: Dr. Delma Officer  Assistant: Dr. Ervin Knack and Hildred Priest, NP  Anesthesia: General tracheal  Estimated blood loss: 11 L  Specimens: Arteriovenous malformation  Description of procedure: The patient was brought to the operating room by the anesthesia team.  General endotracheal anesthesia was induced.  The patient remained in the supine position.  A roll was placed on her right shoulder and the Mayfield three-point headrest was applied to her calvarium.  Her head was turned to the left exposing her right scalp.  The patient's staples were removed.  The wound was then prepared with Betadine scrub and Betadine solution.  Sterile drapes were applied.  I then used a scalpel to incise through the patient's fresh craniotomy incision.  We used Raney clips for wound edge hemostasis.  We used towel clamps and rubber bands for exposure.  I then ligated the sutures  approximated the temporalis muscle and fascia.  We then used the screwdriver to remove the titanium mini screws and then remove the patient's craniotomy flap.  We then removed the epidural Gelfoam.  We then ligated the sutures which were reapproximated the patient's dura.  We had the release of spinal fluid under high pressure.  We immediately encountered a small subdural hematoma which we removed with suction and irrigation.  Beneath the subdural hematoma was the intracerebral hemorrhage in the AVM resection bed.  We remove the anterior blood easily and uneventfully with suction and irrigation.  We could see more hematoma tracking posteriorly in vivo and on the CT scan.  We carefully dissected posteriorly and encountered aneurysm clips.  Upon removing some of the hematoma there was tremendous arterial bleeding insistent with bleeding from an arteriovenous malformation.  Around this time the patient lost her pulse and CPR was performed while we were trying to obtain hemostasis.  The patient was eventually revived and given 4 units of packed red blood cells and other blood products.  We lost quite a bit of blood before we could obtain somewhat adequate hemostasis.  There was more or less a constant arterial ooze from the posterior temporal lobe.  We carefully dissected posteriorly and were able to circumscribe what appeared to be residual arteriovenous malformation with large feeding vessels.  We continue to work around the arteriovenous malformation circumferentially ligating the feeding vessels.  We counted more brisk bleeding posteriorly.  Some of the bleeding appeared to be from dural feeding vessels.  Some was more bleeding posterior and along the tentorium which we controlled with electrocautery and cottonoid patties..  We continue  to dissected around what we believed was residual nidus and resected it.  By this point the patient's brain was quite swollen and herniating out through the craniotomy flap.  We had  anesthesia give mannitol and we resected some obviously nonviable temporal lobe.  We did the best we could to obtain hemostasis.  At this point it appeared our efforts were futile and the patient would not survive.  We then reapproximated the patient's dura.  We then replaced the craniotomy flap with titanium mini plates and screws.  We reapproximated the patient's temporalis muscle and fascia with interrupted 2-0 Vicryl suture.  We remove the retractors.  We reapproximated the galea with interrupted 2-0 Vicryl suture.  We reapproximated the skin with a running 0 nylon suture.  Of note, upon removing the multiple cottonoid patties one of the strings came back without a patty attached.  This was identified as a half by half patty a via counting.  We searched for the patty throughout the resection cavity but never could find it.  We obtained skull x-rays to help [place] localize where the patty was but again we could not see it.  Given that we do not expect the patient to survive this we gave up the search.  I then removed the Mayfield three-point headrest from the patient's calvarium.  The patient was transferred to the ICU intubated.

## 2019-07-22 NOTE — Progress Notes (Signed)
Attempted to fully wake patient up for 0115 medication admin, unable to completely safely swallow water.  Spoke to Dr.Aroor earlier in shift and gave orders to switch IV if continued to not be able to safely swallow.

## 2019-07-22 NOTE — Anesthesia Postprocedure Evaluation (Signed)
Anesthesia Post Note  Patient: Sara Adams  Procedure(s) Performed: Right Craniotomy for evacuation of Intracerebral Hemorrhage (Right Head)     Patient location during evaluation: PACU Anesthesia Type: General Level of consciousness: patient remains intubated per anesthesia plan, sedated and comatose Pain management: pain level controlled Vital Signs Assessment: vitals unstable Respiratory status: patient on ventilator - see flowsheet for VS Cardiovascular status: unstable Anesthetic complications: no Comments: Transferred to ICU. Spoke with CCM and son at bedside.    Last Vitals:  Vitals:   07/26/2019 1900 07/20/2019 1915  BP: 105/68   Pulse: 79 77  Resp: (!) 25 (!) 25  Temp:    SpO2: 100% 100%    Last Pain:  Vitals:   07/13/2019 0800  TempSrc: Axillary  PainSc:                  Lewie Loron

## 2019-07-22 NOTE — Progress Notes (Signed)
PT Cancellation Note  Patient Details Name: Sara Adams MRN: [id number] DOB: 24-Feb-1946   Cancelled Treatment:    Reason Eval/Treat Not Completed: Medical issues which prohibited therapy. Pt emergently taken to OR for R temporal hematoma evacuation. PT will follow up once patient is medically stable and appropriate for PT intervention.   Arlyss Gandy Aug 03, 2019, 1:53 PM

## 2019-07-22 NOTE — Progress Notes (Signed)
I spoke with the patient's son, Jinger Neighbors.  I informed him of the events of surgery.  I told him that unfortunately his mother will not survive this.  I answered all his questions and offered my condolences.  He tells me his brother is coming to the hospital from [place].  I recommended that once they say their goodbyes, they consider withdrawing support as her prognosis is dismal.

## 2019-07-22 NOTE — Transfer of Care (Signed)
Immediate Anesthesia Transfer of Care Note  Patient: Sara Adams  Procedure(s) Performed: Right Craniotomy for evacuation of Intracerebral Hemorrhage (Right Head)  Patient Location: ICU  Anesthesia Type:General  Level of Consciousness: sedated and Patient remains intubated per anesthesia plan  Airway & Oxygen Therapy: Patient remains intubated per anesthesia plan and Patient placed on Ventilator (see vital sign flow sheet for setting)  Post-op Assessment: Report given to RN and Post -op Vital signs reviewed and stable  Post vital signs: Reviewed and stable  Last Vitals:  Vitals Value Taken Time  BP 104/69 08/21/2019 1700  Temp    Pulse 92 2019-08-21 1708  Resp 23 08-21-19 1709  SpO2 100 % Aug 21, 2019 1708  Vitals shown include unvalidated device data.  Last Pain:  Vitals:   2019-08-21 0800  TempSrc: Axillary  PainSc:       Patients Stated Pain Goal: 0 (07/20/19 0800)  Complications: No apparent anesthesia complications

## 2019-07-23 LAB — BPAM PLATELET PHERESIS
Blood Product Expiration Date: 202103282359
Blood Product Expiration Date: 202103292359
ISSUE DATE / TIME: 202103271418
ISSUE DATE / TIME: 202103271509
Unit Type and Rh: 6200
Unit Type and Rh: 7300

## 2019-07-23 LAB — BPAM CRYOPRECIPITATE
Blood Product Expiration Date: 202103272015
Blood Product Expiration Date: 202103272015
ISSUE DATE / TIME: 202103271444
ISSUE DATE / TIME: 202103271444
Unit Type and Rh: 5100
Unit Type and Rh: 5100

## 2019-07-23 LAB — PREPARE PLATELET PHERESIS
Unit division: 0
Unit division: 0

## 2019-07-23 LAB — PREPARE CRYOPRECIPITATE
Unit division: 0
Unit division: 0

## 2019-07-23 MED ORDER — GLYCOPYRROLATE 0.2 MG/ML IJ SOLN: 0.2000 mg | INTRAMUSCULAR | Status: DC | PRN

## 2019-07-23 MED ORDER — HALOPERIDOL LACTATE 5 MG/ML IJ SOLN: 0.5000 mg | INTRAMUSCULAR | Status: DC | PRN

## 2019-07-23 MED ORDER — ACETAMINOPHEN 325 MG PO TABS: 650.0000 mg | ORAL_TABLET | Freq: Four times a day (QID) | ORAL | Status: DC | PRN

## 2019-07-23 MED ORDER — SODIUM CHLORIDE 0.9 % IV SOLN: 250.0000 mL | INTRAVENOUS | Status: DC | PRN

## 2019-07-23 MED ORDER — ACETAMINOPHEN 650 MG RE SUPP: 650.0000 mg | Freq: Four times a day (QID) | RECTAL | Status: DC | PRN

## 2019-07-23 MED ORDER — BIOTENE DRY MOUTH MT LIQD: 15.0000 mL | OROMUCOSAL | Status: DC | PRN

## 2019-07-23 MED ORDER — ONDANSETRON HCL 4 MG/2ML IJ SOLN: 4.0000 mg | Freq: Four times a day (QID) | INTRAMUSCULAR | Status: DC | PRN

## 2019-07-23 MED ORDER — GLYCOPYRROLATE 1 MG PO TABS: 1.0000 mg | ORAL_TABLET | ORAL | Status: DC | PRN

## 2019-07-23 MED ORDER — SODIUM CHLORIDE 0.9% FLUSH: 3.0000 mL | Freq: Two times a day (BID) | INTRAVENOUS | Status: DC

## 2019-07-23 MED ORDER — HYDROMORPHONE BOLUS VIA INFUSION
0.5000 mg | INTRAVENOUS | Status: DC | PRN
  Filled 2019-07-23: qty 1

## 2019-07-23 MED ORDER — SODIUM CHLORIDE 0.9% FLUSH: 3.0000 mL | INTRAVENOUS | Status: DC | PRN

## 2019-07-23 MED ORDER — SODIUM CHLORIDE 0.9 % IV SOLN
1.0000 mg/h | INTRAVENOUS | Status: DC
  Filled 2019-07-23: qty 5

## 2019-07-23 MED ORDER — HALOPERIDOL 0.5 MG PO TABS
0.5000 mg | ORAL_TABLET | ORAL | Status: DC | PRN
  Filled 2019-07-23: qty 1

## 2019-07-23 MED ORDER — LORAZEPAM 2 MG/ML IJ SOLN: 0.5000 mg | INTRAMUSCULAR | Status: DC | PRN

## 2019-07-23 MED ORDER — ONDANSETRON 4 MG PO TBDP: 4.0000 mg | ORAL_TABLET | Freq: Four times a day (QID) | ORAL | Status: DC | PRN

## 2019-07-23 MED ORDER — HALOPERIDOL LACTATE 2 MG/ML PO CONC
0.5000 mg | ORAL | Status: DC | PRN
  Filled 2019-07-23: qty 0.3

## 2019-07-23 MED ORDER — POLYVINYL ALCOHOL 1.4 % OP SOLN
1.0000 [drp] | Freq: Four times a day (QID) | OPHTHALMIC | Status: DC | PRN
  Filled 2019-07-23: qty 15

## 2019-07-24 ENCOUNTER — Encounter: Payer: Self-pay | Admitting: *Deleted

## 2019-07-24 LAB — BPAM FFP
Blood Product Expiration Date: 202103312359
Blood Product Expiration Date: 202103312359
Blood Product Expiration Date: 202103312359
Blood Product Expiration Date: 202103312359
Blood Product Expiration Date: 202103312359
Blood Product Expiration Date: 202103312359
Blood Product Expiration Date: 202103312359
Blood Product Expiration Date: 202104012359
Blood Product Expiration Date: 202104012359
Blood Product Expiration Date: 202104012359
Blood Product Expiration Date: 202104012359
Blood Product Expiration Date: 202104012359
Blood Product Expiration Date: 202104012359
Blood Product Expiration Date: 202104012359
Blood Product Expiration Date: 202104012359
Blood Product Expiration Date: 202104082359
Blood Product Expiration Date: 202104102359
Blood Product Expiration Date: 202104112359
Blood Product Expiration Date: 202104112359
ISSUE DATE / TIME: 202103271354
ISSUE DATE / TIME: 202103271354
ISSUE DATE / TIME: 202103271354
ISSUE DATE / TIME: 202103271354
ISSUE DATE / TIME: 202103271402
ISSUE DATE / TIME: 202103271402
ISSUE DATE / TIME: 202103271402
ISSUE DATE / TIME: 202103271402
ISSUE DATE / TIME: 202103271413
ISSUE DATE / TIME: 202103271423
ISSUE DATE / TIME: 202103271423
ISSUE DATE / TIME: 202103271423
ISSUE DATE / TIME: 202103290820
ISSUE DATE / TIME: 202103290820
ISSUE DATE / TIME: 202103290820
ISSUE DATE / TIME: 202103290820
ISSUE DATE / TIME: 202103290820
ISSUE DATE / TIME: 202103290820
ISSUE DATE / TIME: 202103290820
Unit Type and Rh: 5100
Unit Type and Rh: 600
Unit Type and Rh: 600
Unit Type and Rh: 600
Unit Type and Rh: 6200
Unit Type and Rh: 6200
Unit Type and Rh: 6200
Unit Type and Rh: 6200
Unit Type and Rh: 6200
Unit Type and Rh: 6200
Unit Type and Rh: 6200
Unit Type and Rh: 6200
Unit Type and Rh: 6200
Unit Type and Rh: 6200
Unit Type and Rh: 6200
Unit Type and Rh: 6200
Unit Type and Rh: 6200
Unit Type and Rh: 6200
Unit Type and Rh: 9500

## 2019-07-24 LAB — PREPARE FRESH FROZEN PLASMA
Unit division: 0
Unit division: 0
Unit division: 0
Unit division: 0
Unit division: 0
Unit division: 0
Unit division: 0
Unit division: 0
Unit division: 0
Unit division: 0
Unit division: 0

## 2019-07-24 NOTE — Addendum Note (Signed)
Addendum  created 07/24/19 0844 by Sonda Primes, CRNA   Order list changed

## 2019-07-25 LAB — SURGICAL PATHOLOGY

## 2019-07-26 LAB — TYPE AND SCREEN
ABO/RH(D): O POS
Antibody Screen: NEGATIVE
Unit division: 0
Unit division: 0
Unit division: 0
Unit division: 0
Unit division: 0
Unit division: 0
Unit division: 0
Unit division: 0
Unit division: 0
Unit division: 0
Unit division: 0
Unit division: 0
Unit division: 0
Unit division: 0
Unit division: 0
Unit division: 0
Unit division: 0
Unit division: 0
Unit division: 0
Unit division: 0
Unit division: 0
Unit division: 0
Unit division: 0
Unit division: 0

## 2019-07-26 LAB — BPAM RBC
Blood Product Expiration Date: 202104212359
Blood Product Expiration Date: 202104222359
Blood Product Expiration Date: 202104222359
Blood Product Expiration Date: 202104232359
Blood Product Expiration Date: 202104282359
Blood Product Expiration Date: 202104282359
Blood Product Expiration Date: 202104282359
Blood Product Expiration Date: 202104282359
Blood Product Expiration Date: 202104282359
Blood Product Expiration Date: 202104282359
Blood Product Expiration Date: 202104292359
Blood Product Expiration Date: 202104302359
Blood Product Expiration Date: 202104302359
Blood Product Expiration Date: 202105012359
Blood Product Expiration Date: 202105012359
Blood Product Expiration Date: 202105012359
Blood Product Expiration Date: 202105012359
Blood Product Expiration Date: 202105012359
Blood Product Expiration Date: 202105012359
Blood Product Expiration Date: 202105012359
Blood Product Expiration Date: 202105012359
Blood Product Expiration Date: 202105012359
Blood Product Expiration Date: 202105012359
Blood Product Expiration Date: 202105012359
ISSUE DATE / TIME: 202103271300
ISSUE DATE / TIME: 202103271300
ISSUE DATE / TIME: 202103271348
ISSUE DATE / TIME: 202103271348
ISSUE DATE / TIME: 202103271351
ISSUE DATE / TIME: 202103271351
ISSUE DATE / TIME: 202103271351
ISSUE DATE / TIME: 202103271351
ISSUE DATE / TIME: 202103271400
ISSUE DATE / TIME: 202103271400
ISSUE DATE / TIME: 202103271400
ISSUE DATE / TIME: 202103271400
ISSUE DATE / TIME: 202103271408
ISSUE DATE / TIME: 202103271408
ISSUE DATE / TIME: 202103271408
ISSUE DATE / TIME: 202103271408
ISSUE DATE / TIME: 202103271425
ISSUE DATE / TIME: 202103271505
ISSUE DATE / TIME: 202103271905
ISSUE DATE / TIME: 202103271955
ISSUE DATE / TIME: 202103272105
ISSUE DATE / TIME: 202103281618
ISSUE DATE / TIME: 202103281909
ISSUE DATE / TIME: 202103290335
Unit Type and Rh: 5100
Unit Type and Rh: 5100
Unit Type and Rh: 5100
Unit Type and Rh: 5100
Unit Type and Rh: 5100
Unit Type and Rh: 5100
Unit Type and Rh: 5100
Unit Type and Rh: 5100
Unit Type and Rh: 5100
Unit Type and Rh: 5100
Unit Type and Rh: 5100
Unit Type and Rh: 5100
Unit Type and Rh: 5100
Unit Type and Rh: 5100
Unit Type and Rh: 5100
Unit Type and Rh: 5100
Unit Type and Rh: 5100
Unit Type and Rh: 5100
Unit Type and Rh: 5100
Unit Type and Rh: 5100
Unit Type and Rh: 5100
Unit Type and Rh: 5100
Unit Type and Rh: 5100
Unit Type and Rh: 5100

## 2019-07-27 NOTE — Discharge Summary (Signed)
Physician Discharge Summary  Patient ID: Sara Adams MRN: [id number] DOB/AGE: 75-Aug-1947 75 y.o.  Admit date: 07-26-19 Discharge date: 07/24/2019  Admission Diagnoses: Right cerebral arteriovenous malformation, seizures  Discharge Diagnoses: The same and postoperative hemorrhage Active Problems:   Cerebral AVM   AVM (arteriovenous malformation)   Cerebral arteriovenous malformation   Discharged Condition: Dead  Hospital Course: Dr. Conchita Paris admitted the patient on 07-26-2019 for an elective resection of a right sided arteriovenous malformation.  For details of the operation please refer to his operative note.  The patient had a progressive neurologic decline.  A follow-up head CT on 07/06/2019 demonstrated a large right temporal hemorrhage.  I discussed the situation with the patient's son and recommended surgery.  He consented on behalf of his mother.  I performed a right anatomy for evacuation of hematoma and resection of residual AVM on 06/30/2019.  At surgery there was tremendous bleeding, consistent with bleeding from arteriovenous malformation.  Our efforts were futile.  Discussed the situation with the patient's son and recommended comfort care only and withdrawal of support.  Family members arrived and said their goodbyes and she was extubated on 07/17/2019.  She expired at 0336.    Consults: Critical care medicine Significant Diagnostic Studies: Cerebral arteriogram, head CT Treatments: Craniotomy for resection of arteriovenous malformations x2 Discharge Exam: Blood pressure (!) 35/27, pulse 89, temperature 99.9 F (37.7 C), temperature source Axillary, resp. rate (!) 0, height 5\' 6"  (1.676 m), weight 65.8 kg, SpO2 (!) 71 %. Deceased  Disposition: Morgue   Allergies as of 07/20/2019      Reactions   Asa [aspirin]    UNSPECIFIED REACTION    Codeine    UNSPECIFIED REACTION    Penicillins    UNSPECIFIED REACTION    Sulfa Antibiotics    UNSPECIFIED REACTION        Medication List    ASK your doctor about these medications   ALPRAZolam 0.5 MG tablet Commonly known as: XANAX Take 0.5 mg by mouth 2 (two) times daily as needed for anxiety.   Cholecalciferol 25 MCG (1000 UT) tablet Take 1,000 Units by mouth daily.   ibuprofen 200 MG tablet Commonly known as: ADVIL Take 200-400 mg by mouth every 6 (six) hours as needed for headache or moderate pain.        Signed: 07/25/2019 07/24/2019, 8:55 AM

## 2019-07-27 NOTE — Accreditation Note (Signed)
Restraints not reported to CMS Pursuant to regulation 482.13 (G) (3) use of soft wrist restraints was logged 07/25/2019

## 2019-07-27 NOTE — Procedures (Signed)
Extubation Procedure Note  Patient Details:   Name: Anavey Coombes DOB: [birth year] MRN: [id number]   Airway Documentation:    Vent end date: 07/13/2019 Vent end time: 0128   Evaluation  O2 sats: currently acceptable Complications: No apparent complications Patient did tolerate procedure well. Bilateral Breath Sounds: Clear   Pt extubated at this time for comfort measures with family and RN at bedside.   Loyal Jacobson [facility] 06/26/2019, 1:31 AM

## 2019-07-27 NOTE — Progress Notes (Signed)
Pt's family at bedside, ready for terminal extubation and full comfort care. CCM aware - requested for full comfort care and extubation orders.

## 2019-07-27 NOTE — Progress Notes (Signed)
50 mg hydromorphone IV bag wasted in [employer] container in the [employer]. Moss Mc, PharmD, was witness.

## 2019-07-27 NOTE — Progress Notes (Signed)
eLink Physician-Brief Progress Note Patient Name: Sara Adams DOB: 1945/11/12 MRN: [id number]   Date of Service  07/11/2019  HPI/Events of Note  Terminal extubation and comfort measures verified with patient's family who were gathered around her in the room. I explained the process and what to expect post-extubation.  eICU Interventions  Terminal extubation and comfort measures orders entered.        Abigayl Hor U Talisa Petrak 07/04/2019, 1:25 AM

## 2019-07-27 NOTE — Progress Notes (Signed)
Pt expired peacefully at 0336 while surrounded by family.  Family happy with care and appreciated services.    Bedside commode and walker placed at front desk with her sticker to be reimbursed.    of Fentanyl was wasted with Gay Filler, RN

## 2019-07-27 DEATH — deceased

## 2021-06-01 IMAGING — MR MR MRA HEAD W/O CM
1 series · 18 of 48 positions shown · non-contrast
Comparison: Head MRI 04/07/2019 and cerebral angiogram 05/16/2019

CLINICAL DATA: Cerebral arteriovenous malformation.

EXAM:
MRI HEAD WITHOUT CONTRAST
MRA HEAD WITHOUT CONTRAST
TECHNIQUE: Multiplanar, multiecho pulse sequences of the brain and surrounding
structures were obtained without intravenous contrast. Angiographic
images of the head were obtained using MRA technique without
contrast.

[Series 4: ax (id) · axial · 1.0mm · 0.43mm/px · z∈[-43,+40]mm · 18 of 184 slices shown]
[im 1/184]
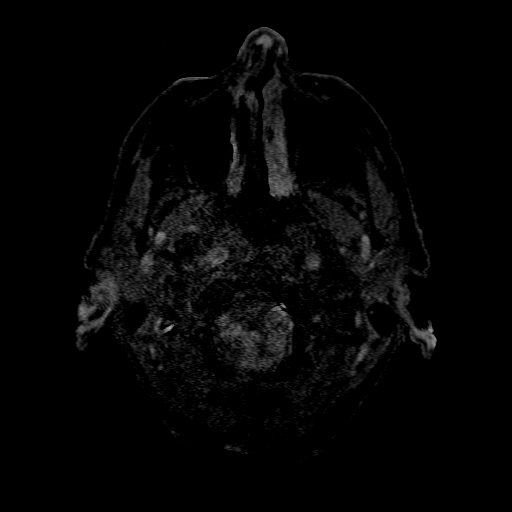
[im 4/184]
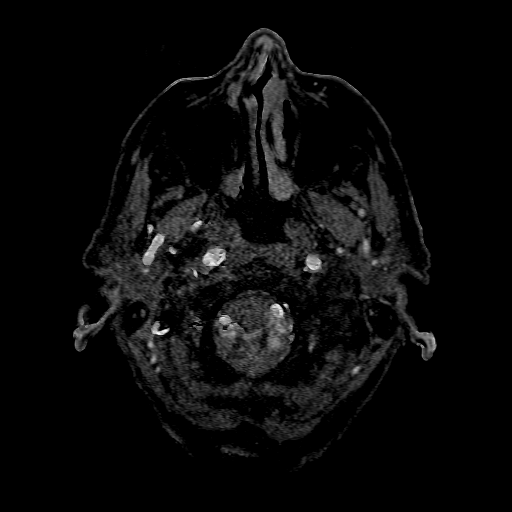
[im 8/184]
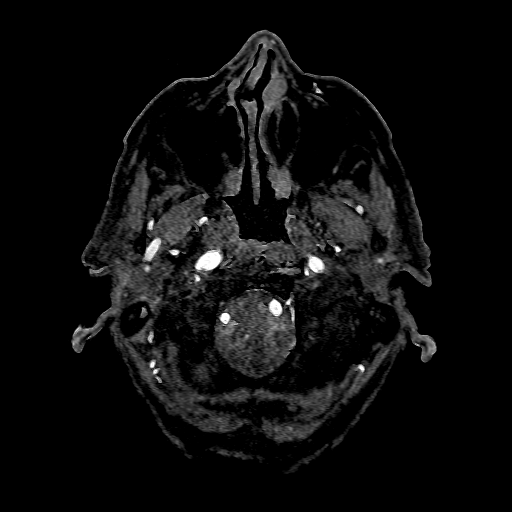
[im 12/184]
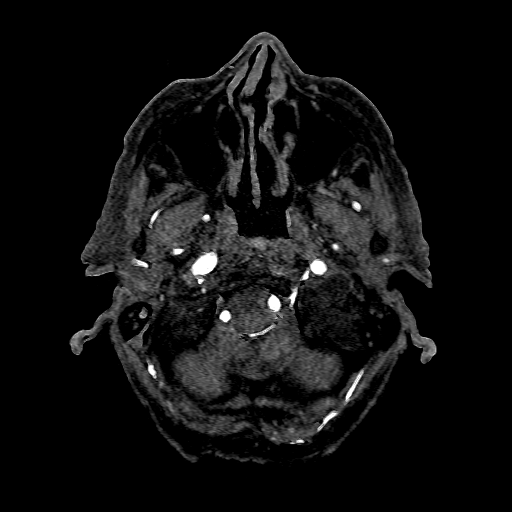
[im 16/184]
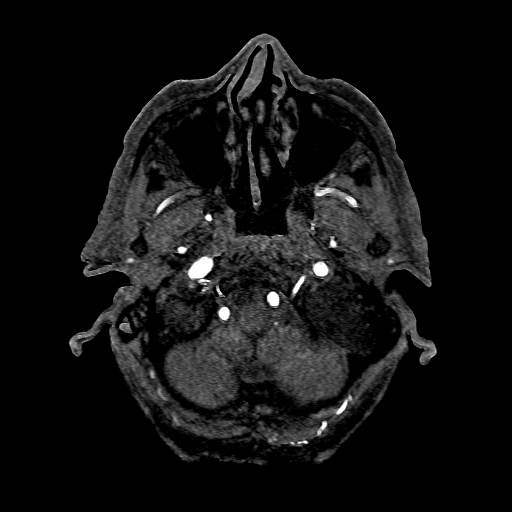
[im 20/184]
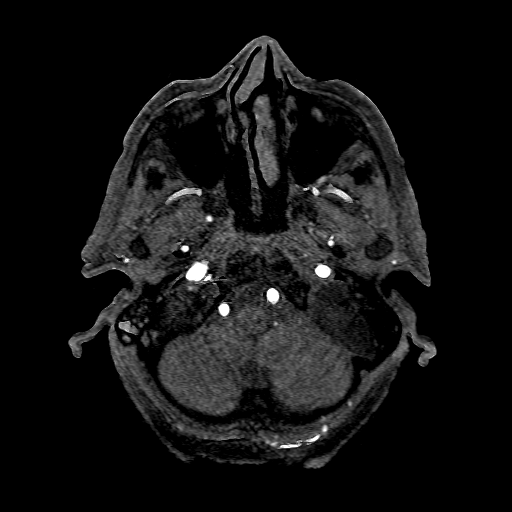
[im 24/184]
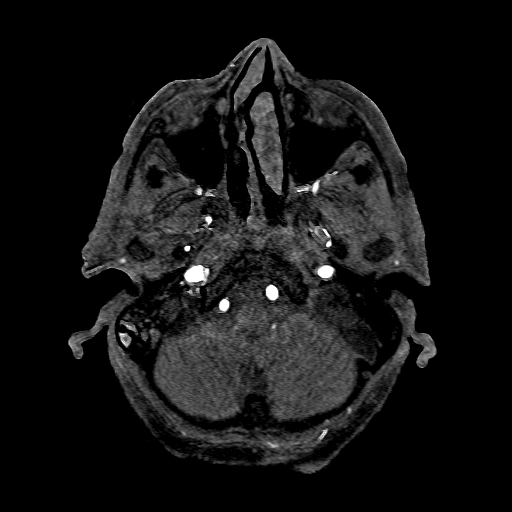
[im 28/184]
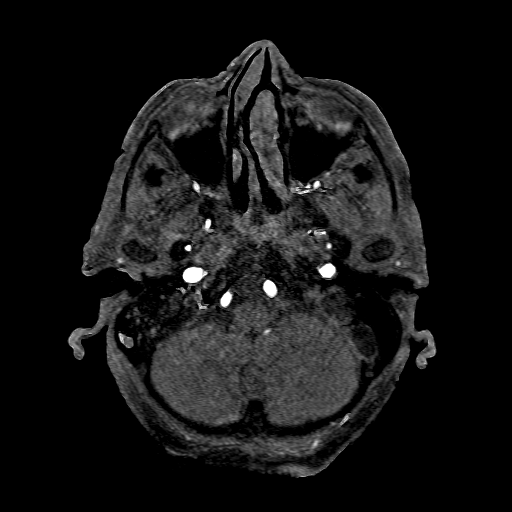
[im 32/184]
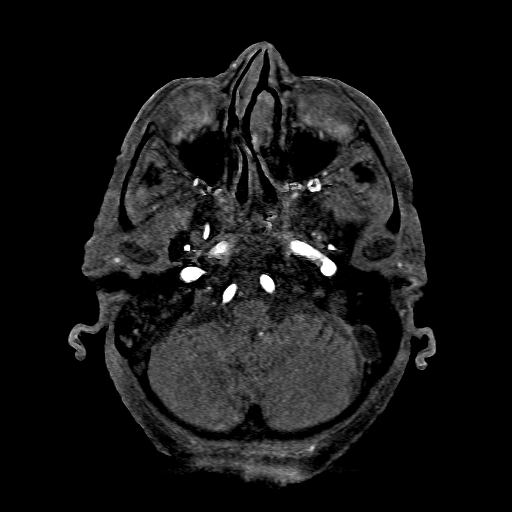
[im 36/184]
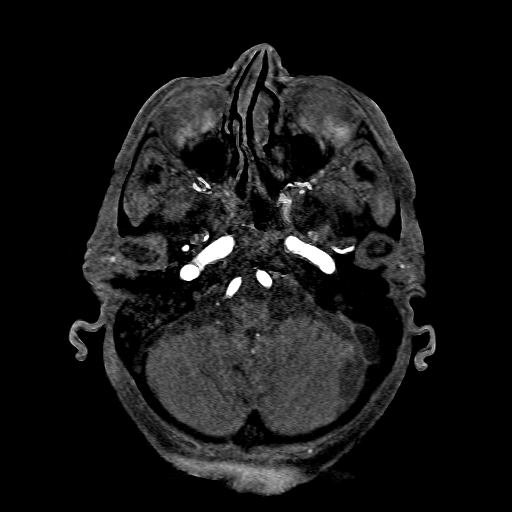
[im 59/184]
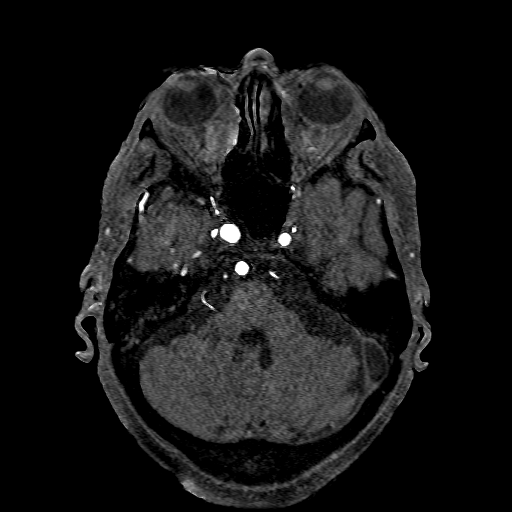
[im 82/184]
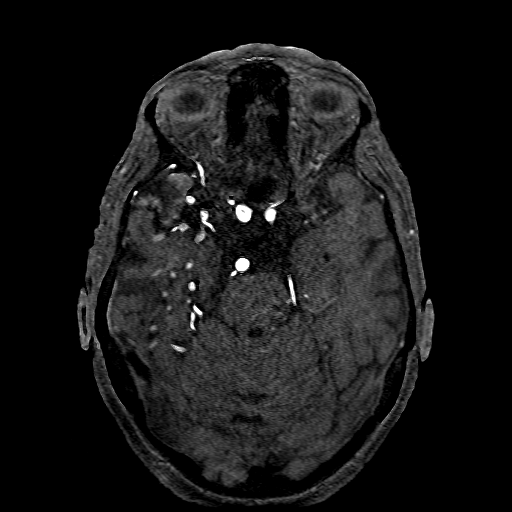
[im 94/184]
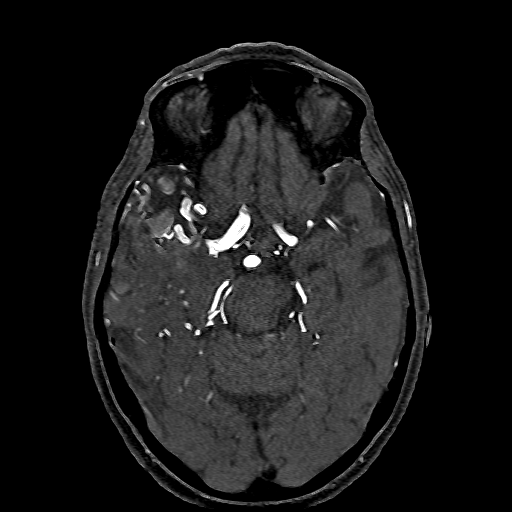
[im 106/184]
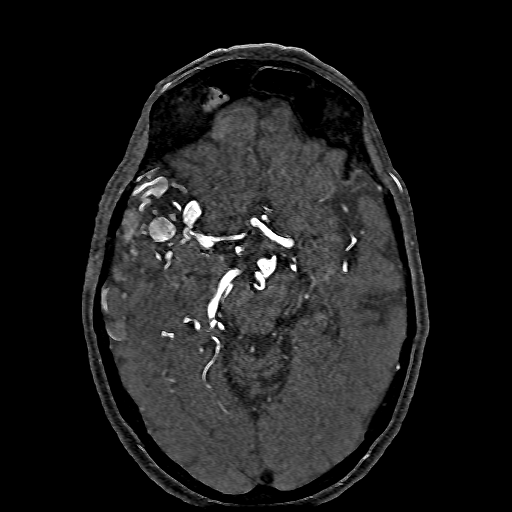
[im 129/184]
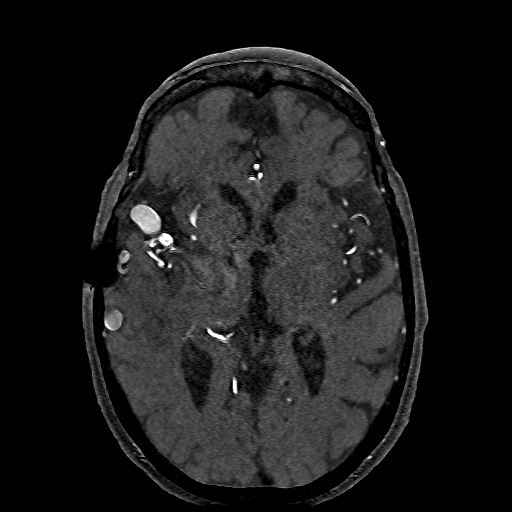
[im 152/184]
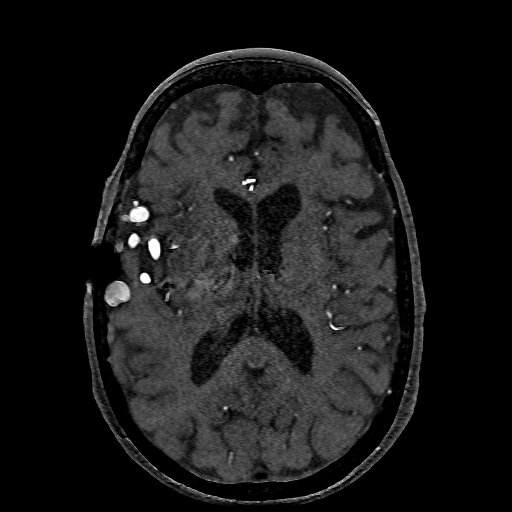
[im 156/184]
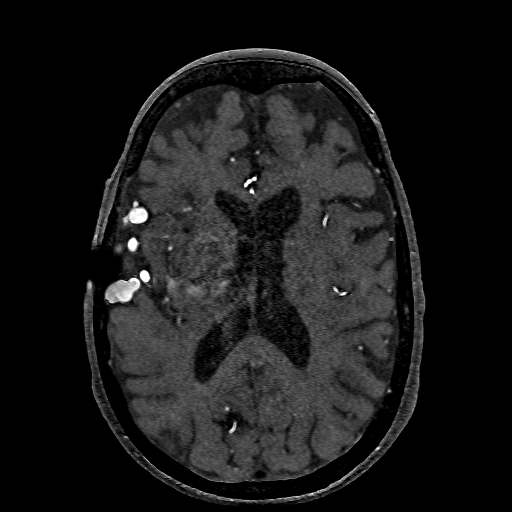
[im 176/184]
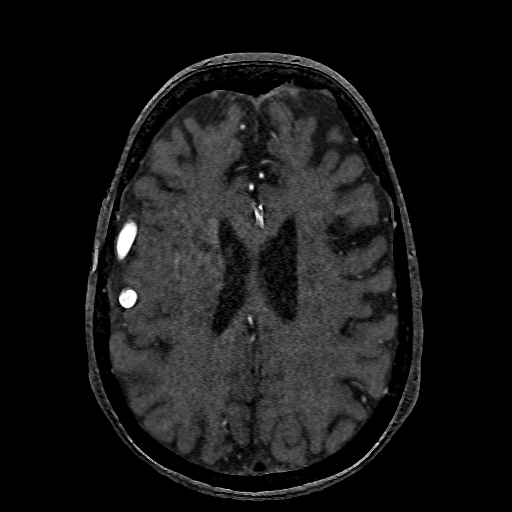

[18 of 48 positions shown; findings below may reference images not displayed]

FINDINGS: MRI HEAD FINDINGS

Brain: A right temporal arteriovenous malformation is unchanged in
appearance from the prior MRI with an approximately 4-5 cm nidus.
Dilated vessels in this region individually measure up to 1.1 cm in
diameter, and dominant venous drainage is again noted through 2
superficial veins which drain into the distal right transverse
sinus. Regional right temporal lobe volume loss is unchanged.

No acute infarct, intracranial hemorrhage, midline shift, or
extra-axial fluid collection is identified. Scattered small foci of
T2 hyperintensity in the cerebral white matter bilaterally are
unchanged and nonspecific but compatible with minimal chronic small
vessel ischemic disease. There is mild global cerebral atrophy.

Vascular: Major intracranial vascular flow voids are preserved with
generalized arterial dolichoectasia again noted. Right temporal AVM
as above.

Skull and upper cervical spine: No suspicious marrow lesion.
Interbody and posterior element ankylosis at C2-3 and C3-4 with
grade 1 anterolisthesis at the latter.

Sinuses/Orbits: Unremarkable orbits. Secretions in the right frontal
sinus. Persistent large right and small left mastoid effusions.

Other: Chronic susceptibility artifact in the right temporal scalp.

MRA HEAD FINDINGS

The visualized distal vertebral arteries are widely patent to the
basilar with the left being mildly dominant. The basilar artery is
widely patent. Patent AICAs and SCAs are seen bilaterally. There are
right larger than left posterior communicating arteries. Both PCAs
are patent without evidence of significant proximal stenosis. There
is asymmetric generalized dolichoectasia involving the right PCA.

The internal carotid arteries are widely patent from skull base to
carotid termini with generalized dolichoectasia greater on the
right. ACAs and MCAs are patent without evidence of proximal branch
occlusion or significant proximal stenosis. The right MCA is ectatic
and supplies the right temporal AVM, and 2 dilated veins are again
noted draining into the distal right transverse sinus. No aneurysm
is identified.
IMPRESSION: 1. Unchanged right temporal arteriovenous malformation.
2. No acute intracranial abnormality.
3. Patent Circle of Willis without evidence of proximal branch
occlusion or significant stenosis.
